# Patient Record
Sex: Female | Born: 1955
Health system: Southern US, Community
[De-identification: ages and names within clinical notes are randomized; demographics above are authoritative.]

## PROBLEM LIST (undated history)

## (undated) DIAGNOSIS — M1990 Unspecified osteoarthritis, unspecified site: Secondary | ICD-10-CM

## (undated) DIAGNOSIS — K219 Gastro-esophageal reflux disease without esophagitis: Secondary | ICD-10-CM

## (undated) DIAGNOSIS — R55 Syncope and collapse: Secondary | ICD-10-CM

## (undated) DIAGNOSIS — Z8709 Personal history of other diseases of the respiratory system: Secondary | ICD-10-CM

## (undated) DIAGNOSIS — D649 Anemia, unspecified: Secondary | ICD-10-CM

## (undated) DIAGNOSIS — D219 Benign neoplasm of connective and other soft tissue, unspecified: Secondary | ICD-10-CM

## (undated) DIAGNOSIS — Z8742 Personal history of other diseases of the female genital tract: Secondary | ICD-10-CM

## (undated) DIAGNOSIS — J45909 Unspecified asthma, uncomplicated: Secondary | ICD-10-CM

## (undated) HISTORY — DX: Unspecified osteoarthritis, unspecified site: M19.90

## (undated) HISTORY — DX: Benign neoplasm of connective and other soft tissue, unspecified: D21.9

## (undated) HISTORY — PX: OTHER SURGICAL HISTORY: SHX169

## (undated) HISTORY — DX: Syncope and collapse: R55

## (undated) HISTORY — PX: COLONOSCOPY: SHX174

## (undated) HISTORY — DX: Unspecified asthma, uncomplicated: J45.909

## (undated) HISTORY — DX: Personal history of other diseases of the respiratory system: Z87.09

## (undated) HISTORY — DX: Personal history of other diseases of the female genital tract: Z87.42

## (undated) HISTORY — DX: Gastro-esophageal reflux disease without esophagitis: K21.9

## (undated) HISTORY — DX: Anemia, unspecified: D64.9

## (undated) HISTORY — PX: BREAST EXCISIONAL BIOPSY: SUR124

---

## 1978-03-17 HISTORY — PX: WISDOM TOOTH EXTRACTION: SHX21

## 1983-03-18 HISTORY — PX: REDUCTION MAMMAPLASTY: SUR839

## 1983-03-18 HISTORY — PX: BREAST SURGERY: SHX581

## 1997-09-20 ENCOUNTER — Ambulatory Visit (HOSPITAL_COMMUNITY): Admission: RE | Admit: 1997-09-20 | Discharge: 1997-09-20 | Payer: Self-pay | Admitting: General Surgery

## 1998-07-25 ENCOUNTER — Other Ambulatory Visit: Admission: RE | Admit: 1998-07-25 | Discharge: 1998-07-25 | Payer: Self-pay | Admitting: Obstetrics

## 1998-10-02 ENCOUNTER — Encounter (HOSPITAL_BASED_OUTPATIENT_CLINIC_OR_DEPARTMENT_OTHER): Payer: Self-pay | Admitting: General Surgery

## 1998-10-02 ENCOUNTER — Ambulatory Visit (HOSPITAL_COMMUNITY): Admission: RE | Admit: 1998-10-02 | Discharge: 1998-10-02 | Payer: Self-pay | Admitting: General Surgery

## 1999-10-01 ENCOUNTER — Other Ambulatory Visit: Admission: RE | Admit: 1999-10-01 | Discharge: 1999-10-01 | Payer: Self-pay | Admitting: Obstetrics

## 1999-10-07 ENCOUNTER — Encounter: Payer: Self-pay | Admitting: Obstetrics

## 1999-10-07 ENCOUNTER — Ambulatory Visit (HOSPITAL_COMMUNITY): Admission: RE | Admit: 1999-10-07 | Discharge: 1999-10-07 | Payer: Self-pay | Admitting: Obstetrics

## 2005-05-29 ENCOUNTER — Ambulatory Visit: Payer: Self-pay | Admitting: Internal Medicine

## 2005-06-02 ENCOUNTER — Ambulatory Visit: Payer: Self-pay | Admitting: Internal Medicine

## 2005-06-06 ENCOUNTER — Encounter: Admission: RE | Admit: 2005-06-06 | Discharge: 2005-06-06 | Payer: Self-pay | Admitting: Internal Medicine

## 2005-06-18 ENCOUNTER — Ambulatory Visit: Payer: Self-pay | Admitting: Gastroenterology

## 2005-06-23 ENCOUNTER — Ambulatory Visit: Payer: Self-pay | Admitting: Gastroenterology

## 2005-07-02 ENCOUNTER — Ambulatory Visit: Payer: Self-pay | Admitting: Gastroenterology

## 2006-06-23 ENCOUNTER — Ambulatory Visit (HOSPITAL_COMMUNITY): Admission: RE | Admit: 2006-06-23 | Discharge: 2006-06-23 | Payer: Self-pay | Admitting: Internal Medicine

## 2006-11-17 ENCOUNTER — Ambulatory Visit: Payer: Self-pay | Admitting: Internal Medicine

## 2006-11-17 ENCOUNTER — Encounter: Payer: Self-pay | Admitting: Internal Medicine

## 2006-12-17 ENCOUNTER — Encounter: Payer: Self-pay | Admitting: Internal Medicine

## 2006-12-17 ENCOUNTER — Ambulatory Visit: Payer: Self-pay | Admitting: Internal Medicine

## 2006-12-17 DIAGNOSIS — R21 Rash and other nonspecific skin eruption: Secondary | ICD-10-CM | POA: Insufficient documentation

## 2007-01-13 ENCOUNTER — Ambulatory Visit: Payer: Self-pay | Admitting: Internal Medicine

## 2007-01-13 ENCOUNTER — Other Ambulatory Visit: Admission: RE | Admit: 2007-01-13 | Discharge: 2007-01-13 | Payer: Self-pay | Admitting: Internal Medicine

## 2007-01-13 ENCOUNTER — Encounter: Payer: Self-pay | Admitting: Internal Medicine

## 2007-01-13 LAB — CONVERTED CEMR LAB
BUN: 9 mg/dL (ref 6–23)
Basophils Absolute: 0 10*3/uL (ref 0.0–0.1)
Creatinine, Ser: 0.8 mg/dL (ref 0.4–1.2)
Eosinophils Absolute: 0.1 10*3/uL (ref 0.0–0.6)
GFR calc Af Amer: 98 mL/min
GFR calc non Af Amer: 81 mL/min
Hemoglobin: 11.5 g/dL — ABNORMAL LOW (ref 12.0–15.0)
MCHC: 34.2 g/dL (ref 30.0–36.0)
MCV: 81 fL (ref 78.0–100.0)
Monocytes Absolute: 0.6 10*3/uL (ref 0.2–0.7)
Monocytes Relative: 9.3 % (ref 3.0–11.0)
Neutrophils Relative %: 56.8 % (ref 43.0–77.0)
Potassium: 4 meq/L (ref 3.5–5.1)
RDW: 14.9 % — ABNORMAL HIGH (ref 11.5–14.6)
Sodium: 138 meq/L (ref 135–145)

## 2007-01-14 DIAGNOSIS — D259 Leiomyoma of uterus, unspecified: Secondary | ICD-10-CM | POA: Insufficient documentation

## 2007-01-14 DIAGNOSIS — R1314 Dysphagia, pharyngoesophageal phase: Secondary | ICD-10-CM | POA: Insufficient documentation

## 2007-06-24 ENCOUNTER — Ambulatory Visit (HOSPITAL_COMMUNITY): Admission: RE | Admit: 2007-06-24 | Discharge: 2007-06-24 | Payer: Self-pay | Admitting: Internal Medicine

## 2007-07-06 ENCOUNTER — Encounter (INDEPENDENT_AMBULATORY_CARE_PROVIDER_SITE_OTHER): Payer: Self-pay | Admitting: Obstetrics and Gynecology

## 2007-07-06 ENCOUNTER — Inpatient Hospital Stay (HOSPITAL_COMMUNITY): Admission: RE | Admit: 2007-07-06 | Discharge: 2007-07-08 | Payer: Self-pay | Admitting: Obstetrics and Gynecology

## 2007-07-06 HISTORY — PX: TOTAL ABDOMINAL HYSTERECTOMY W/ BILATERAL SALPINGOOPHORECTOMY: SHX83

## 2007-08-16 ENCOUNTER — Ambulatory Visit: Payer: Self-pay | Admitting: Internal Medicine

## 2007-08-16 DIAGNOSIS — J069 Acute upper respiratory infection, unspecified: Secondary | ICD-10-CM | POA: Insufficient documentation

## 2007-10-28 ENCOUNTER — Encounter: Payer: Self-pay | Admitting: Internal Medicine

## 2008-03-09 ENCOUNTER — Telehealth: Payer: Self-pay | Admitting: Internal Medicine

## 2008-07-15 LAB — CONVERTED CEMR LAB: Pap Smear: NORMAL

## 2008-09-26 ENCOUNTER — Ambulatory Visit (HOSPITAL_COMMUNITY): Admission: RE | Admit: 2008-09-26 | Discharge: 2008-09-26 | Payer: Self-pay | Admitting: Internal Medicine

## 2009-07-03 ENCOUNTER — Ambulatory Visit: Payer: Self-pay | Admitting: Internal Medicine

## 2009-07-03 DIAGNOSIS — J309 Allergic rhinitis, unspecified: Secondary | ICD-10-CM | POA: Insufficient documentation

## 2009-07-04 ENCOUNTER — Telehealth: Payer: Self-pay | Admitting: Internal Medicine

## 2009-07-05 ENCOUNTER — Encounter (INDEPENDENT_AMBULATORY_CARE_PROVIDER_SITE_OTHER): Payer: Self-pay | Admitting: *Deleted

## 2009-07-05 ENCOUNTER — Ambulatory Visit: Payer: Self-pay | Admitting: Internal Medicine

## 2009-07-05 DIAGNOSIS — K5732 Diverticulitis of large intestine without perforation or abscess without bleeding: Secondary | ICD-10-CM | POA: Insufficient documentation

## 2009-07-05 LAB — CONVERTED CEMR LAB
Basophils Absolute: 0 10*3/uL (ref 0.0–0.1)
Bilirubin Urine: NEGATIVE
Eosinophils Relative: 1.1 % (ref 0.0–5.0)
HCT: 37.9 % (ref 36.0–46.0)
Leukocytes, UA: NEGATIVE
Lymphocytes Relative: 29.5 % (ref 12.0–46.0)
Monocytes Relative: 8.5 % (ref 3.0–12.0)
Neutrophils Relative %: 60.4 % (ref 43.0–77.0)
Nitrite: NEGATIVE
Platelets: 328 10*3/uL (ref 150.0–400.0)
RDW: 14.9 % — ABNORMAL HIGH (ref 11.5–14.6)
Specific Gravity, Urine: >=1.03 (ref 1.000–1.030)
WBC: 6.5 10*3/uL (ref 4.5–10.5)
pH: 6 (ref 5.0–8.0)

## 2009-07-10 ENCOUNTER — Telehealth: Payer: Self-pay | Admitting: Internal Medicine

## 2009-07-13 ENCOUNTER — Encounter (INDEPENDENT_AMBULATORY_CARE_PROVIDER_SITE_OTHER): Payer: Self-pay | Admitting: *Deleted

## 2009-07-27 ENCOUNTER — Encounter (INDEPENDENT_AMBULATORY_CARE_PROVIDER_SITE_OTHER): Payer: Self-pay | Admitting: *Deleted

## 2009-07-31 ENCOUNTER — Ambulatory Visit: Payer: Self-pay | Admitting: Gastroenterology

## 2009-08-14 ENCOUNTER — Ambulatory Visit: Payer: Self-pay | Admitting: Gastroenterology

## 2009-09-12 ENCOUNTER — Emergency Department (HOSPITAL_COMMUNITY): Admission: EM | Admit: 2009-09-12 | Discharge: 2009-09-12 | Payer: Self-pay | Admitting: Emergency Medicine

## 2009-09-27 ENCOUNTER — Ambulatory Visit (HOSPITAL_COMMUNITY): Admission: RE | Admit: 2009-09-27 | Discharge: 2009-09-27 | Payer: Self-pay | Admitting: Internal Medicine

## 2010-04-16 NOTE — Procedures (Signed)
Summary: Colonoscopy  Patient: Heidi Stevens Note: All result statuses are Final unless otherwise noted.  Tests: (1) Colonoscopy (COL)   COL Colonoscopy           [facility]ONE      [facility]     [address]     [place], [place]  [address]           COLONOSCOPY PROCEDURE REPORT           PATIENT:  Heidi Stevens, Heidi Stevens  MR#:  [id number]     BIRTHDATE:  10/24/1955, 53 yrs. old  GENDER:  female     ENDOSCOPIST:  Rachael Fee, MD     REF. BY:  Rosalyn Gess. Norins, M.D.     PROCEDURE DATE:  08/14/2009     PROCEDURE:  Diagnostic Colonoscopy     ASA CLASS:  Class II     INDICATIONS:  Routine Risk Screening     MEDICATIONS:  Fentanyl 75 mcg IV, Versed 7 mg IV           DESCRIPTION OF PROCEDURE:   After the risks benefits and     alternatives of the procedure were thoroughly explained, informed     consent was obtained.  Digital rectal exam was performed and     revealed no rectal masses.   The  endoscope was introduced through     the anus and advanced to the cecum, which was identified by both     the appendix and ileocecal valve, without limitations.  The     quality of the prep was good, using MoviPrep.  The instrument was     then slowly withdrawn as the colon was fully examined.     <<PROCEDUREIMAGES>>     FINDINGS:  Mild diverticulosis was found in the sigmoid to     descending colon segments (see image1).  This was otherwise a     normal examination of the colon (see image2, image3, and image5).     Retroflexed views in the rectum revealed no abnormalities.    The     scope was then withdrawn from the patient and the procedure     completed.           COMPLICATIONS:  None           ENDOSCOPIC IMPRESSION:     1) Mild diverticulosis in the sigmoid to descending colon     segments     2) Otherwise normal examination     3) No polyps or cancers           RECOMMENDATIONS:     1) Continue current colorectal screening recommendations for     "routine risk" patients with a repeat  colonoscopy in 10 years.           REPEAT EXAM:  10 years           ______________________________     Rachael Fee, MD           n.     eSIGNED:   Rachael Fee at 08/14/2009 09:34 AM           Sloan Leiter, [id number]  Note: An exclamation mark (!) indicates a result that was not dispersed into the flowsheet. Document Creation Date: 08/14/2009 9:35 AM _______________________________________________________________________  (1) Order result status: Final Collection or observation date-time: 08/14/2009 09:32 Requested date-time:  Receipt date-time:  Reported date-time:  Referring Physician:   Ordering Physician: Rob Bunting [phone]) Specimen Source:  Source:  Launa Grill Order Number: [id number] Lab site:   Appended Document: Colonoscopy    Clinical Lists Changes  Observations: Added new observation of COLONNXTDUE: 07/2019 (08/14/2009 12:50)

## 2010-04-16 NOTE — Letter (Signed)
Summary: Out of Work  [facility]  [address] [place], [place] [address]   Phone: [phone]  Fax: [phone]    July 05, 2009   Employee:  Heidi Stevens    To Whom It May Concern:   For Medical reasons, please excuse the above named employee from work for the following dates:  Start: 07/04/09    End: 07/05/09, May return to work tomorrow 07/06/09    If you need additional information, please feel free to contact our office.         Sincerely,    Dr. Rene Paci

## 2010-04-16 NOTE — Miscellaneous (Signed)
Summary: [facility] PREVISIT/PREP  Clinical Lists Changes  Medications: Added new medication of MOVIPREP 100 GM  SOLR (PEG-KCL-NACL-NASULF-NA ASC-C) As per prep instructions. - Signed Rx of MOVIPREP 100 GM  SOLR (PEG-KCL-NACL-NASULF-NA ASC-C) As per prep instructions.;  #1 x 0;  Signed;  Entered by: Wyona Almas RN;  Authorized by: Rachael Fee MD;  Method used: Electronically to [facility] [phone]*, [address] [place], [place], [place]  [address], Ph: [phone] or [phone], Fax: [phone] Observations: Added new observation of NKA: T (07/31/2009 15:09)    Prescriptions: MOVIPREP 100 GM  SOLR (PEG-KCL-NACL-NASULF-NA ASC-C) As per prep instructions.  #1 x 0   Entered by:   Wyona Almas RN   Authorized by:   Rachael Fee MD   Signed by:   Wyona Almas RN on 07/31/2009   Method used:   Electronically to        [facility] [phone]* (retail)       [address]       [place], [place]  [address]       Ph: [phone] or [phone]       Fax: [phone]   RxID:   [id number]

## 2010-04-16 NOTE — Letter (Signed)
Summary: Previsit letter  [facility]  [address] [place], [place] [address]   Phone: [phone]  Fax: [phone]       07/13/2009 MRN: [id number]  [facility] [address] [place], [place]  [address]  Dear Ms. Tavares,  Welcome to the Gastroenterology Division at [facility].    You are scheduled to see a nurse for your pre-procedure visit on 07-31-09 at 3:30p.m. on the 3rd floor at [facility], [address]  We ask that you try to arrive at our office 15 minutes prior to your appointment time to allow for check-in.  Your nurse visit will consist of discussing your medical and surgical history, your immediate family medical history, and your medications.    Please bring a complete list of all your medications or, if you prefer, bring the medication bottles and we will list them.  We will need to be aware of both prescribed and over the counter drugs.  We will need to know exact dosage information as well.  If you are on blood thinners (Coumadin, Plavix, Aggrenox, Ticlid, etc.) please call our office today/prior to your appointment, as we need to consult with your physician about holding your medication.   Please be prepared to read and sign documents such as consent forms, a financial agreement, and acknowledgement forms.  If necessary, and with your consent, a friend or relative is welcome to sit-in on the nurse visit with you.  Please bring your insurance card so that we may make a copy of it.  If your insurance requires a referral to see a specialist, please bring your referral form from your primary care physician.  No co-pay is required for this nurse visit.     If you cannot keep your appointment, please call [phone] to cancel or reschedule prior to your appointment date.  This allows [place] the opportunity to schedule an appointment for another patient in need of care.    Thank you for choosing [facility] for your medical needs.   We appreciate the opportunity to care for you.  Please visit [place] at our website  to learn more about our practice.                     Sincerely.                                                                                                                   The Gastroenterology Division

## 2010-04-16 NOTE — Assessment & Plan Note (Signed)
Summary: abd pain / SD   Vital Signs:  Patient profile:   55 year old female Height:      64 inches (162.56 cm) Weight:      218.0 pounds (99.09 kg) O2 Sat:      98 % on Room air Temp:     98.5 degrees F (36.94 degrees C) oral Pulse rate:   63 / minute BP sitting:   110 / 82  (left arm) Cuff size:   large  Vitals Entered By: Orlan Leavens (July 05, 2009 8:48 AM)  O2 Flow:  Room air CC: Abdominal pain, nausated Is Patient Diabetic? No Pain Assessment Patient in pain? yes     Location: abdomen Type: sharp   Primary Care Provider:  norins  CC:  Abdominal pain and nausated.  History of Present Illness:  Abdominal Pain      This is a 55 year old woman who presents with Abdominal pain.  The symptoms began 2 days ago.  On a scale of 1 to 10, the intensity is described as an 8.  no prior colonoscopy, no known diverticular dz, no prior hx same type symptoms.  The patient reports nausea and anorexia, but denies vomiting, diarrhea, constipation, and hematochezia.  The location of the pain is right lower quadrant.  The pain is described as intermittent, sharp, and radiating to the back.  The patient denies the following symptoms: fever, dysuria, and chest pain.  The pain is worse with movement and during initiation of defication.  The pain is better with completion of defecation and fasting.    Current Medications (verified): 1)  Omeprazole 20 Mg  Tbec (Omeprazole) .Marland Kitchen.. 1 Once Daily  Allergies (verified): No Known Drug Allergies  Past History:  Past Medical History: UCD G2P2  Review of Systems  The patient denies fever, chest pain, syncope, severe indigestion/heartburn, hematuria, and incontinence.    Physical Exam  General:  overweight-appearing.  alert, well-developed, well-nourished, and cooperative to examination.   nontoxic Lungs:  normal respiratory effort, no intercostal retractions or use of accessory muscles; normal breath sounds bilaterally - no crackles and no  wheezes.    Heart:  normal rate, regular rhythm, no murmur, and no rub. BLE without edema.  Abdomen:  tender to mod palp over RLQ - no r/g - +BS, no CVA pain   Impression & Recommendations:  Problem # 1:  DIVERTICULITIS, ACUTE (ICD-562.11) most probable dx - check labs now to r/o UTI/PN but begin emepric cipro/flagyl -  reports is being sched for colo this year (age >15) - Labs Reviewed: Hgb: 11.5 (01/13/2007)   Hct: 33.6 (01/13/2007)   WBC: 6.2 (01/13/2007)  Orders: TLB-CBC Platelet - w/Differential (85025-CBCD) TLB-Udip w/ Micro (81001-URINE)  Complete Medication List: 1)  Omeprazole 20 Mg Tbec (Omeprazole) .Marland Kitchen.. 1 once daily 2)  Cipro 500 Mg Tabs (Ciprofloxacin hcl) .Marland Kitchen.. 1 by mouth two times a day x 7days 3)  Flagyl 500 Mg Tabs (Metronidazole) .Marland Kitchen.. 1 by mouth three times a day x 7 days  Patient Instructions: 1)  it was good to see you today. 2)  test(s) ordered today - your results will be posted on the phone tree for review in 48-72 hours from the time of test completion; call [phone] and enter your 9 digit MRN (listed above on this page, just below your name); if any changes need to be made or there are abnormal results, you will be contacted directly.  3)  start antibiotics for probable diverticulutis symptoms causing your abdominal  pain - cipro + flagyl -your prescriptions have been electronically submitted to your pharmacy. Please take as directed. Contact our office if you believe you're having problems with the medication(s).  4)  Information/education from up to date provided to you 5)  if your symptoms continue to worsen (pain, fever, etc), or if you are unable take anything by mouth (pills, fluids, etc), you should go to the emergency room for further evaluation and treatment.  6)  Drink clear liquids only for the next 24 hours, then slowly add other liquids and food as you  tolerate them.start with bland plain foods like toast, rice, potates and avoid fatty foods or  creams/dairy until you are feeling better Prescriptions: FLAGYL 500 MG TABS (METRONIDAZOLE) 1 by mouth three times a day x 7 days  #21 x 0   Entered and Authorized by:   Newt Lukes MD   Signed by:   Newt Lukes MD on 07/05/2009   Method used:   Electronically to        [facility] [phone]* (retail)       [address]       [place], [place]  [address]       Ph: [phone] or [phone]       Fax: [phone]   RxID:   [id number] CIPRO 500 MG TABS (CIPROFLOXACIN HCL) 1 by mouth two times a day x 7days  #14 x 0   Entered and Authorized by:   Newt Lukes MD   Signed by:   Newt Lukes MD on 07/05/2009   Method used:   Electronically to        [facility] [phone]* (retail)       [address]       [place], [place]  [address]       Ph: [phone] or [phone]       Fax: [phone]   RxID:   [id number]

## 2010-04-16 NOTE — Assessment & Plan Note (Signed)
Summary: FU---STC   Vital Signs:  Patient profile:   55 year old female Height:      64 inches (162.56 cm) Weight:      218.25 pounds (99.20 kg) BMI:     37.60 O2 Sat:      96 % on Room air Temp:     98.6 degrees F (37.00 degrees C) oral Pulse rate:   67 / minute Pulse rhythm:   regular BP sitting:   106 / 76  (left arm) Cuff size:   large  Vitals Entered By: Brenton Grills (July 03, 2009 3:12 PM)  O2 Flow:  Room air CC: pt here for routine ov/pt states she is no longer taking iron or z pack or promethazine cough syrup/aj   Primary Care Provider:  Areana Kosanke  CC:  pt here for routine ov/pt states she is no longer taking iron or z pack or promethazine cough syrup/aj.  History of Present Illness: Patient presents c/o allergic rhinnitis type symptoms with sinus congestion exacerbated by p9ollen and her husbands smoking. She denies any acute illness: no fever, sweats, chills or other sypmtoms.   Current Medications (verified): 1)  Iron 18 Mg Tbcr (Ferrous Fumarate) .... Take 1 Tablet By Mouth Twice A Day 2)  Omeprazole 20 Mg  Tbec (Omeprazole) .Marland Kitchen.. 1 Once Daily 3)  Zithromax Z-Pak 250 Mg  Tabs (Azithromycin) .... As Directed 4)  Promethazine-Codeine 6.25-10 Mg/69ml  Syrp (Promethazine-Codeine) .Marland Kitchen.. 1 Tsp Q 6 Hrs For Cough.  Allergies (verified): No Known Drug Allergies  Past History:  Past Medical History: Last updated: 11/17/2006 UCD G2P2  Past Surgical History: Last updated: 08/16/2007 arthroscopy R knee '94 reduction mammoplasty '85 ([place])   G2P2 w/ c-sections  Family History: Last updated: 08/16/2007 Family History Hypertension family history of diabetes father - '32: HTN, DM, CVA mother - '37: CVA, HTN Neg- breast or colon cancer; CAD  Social History: Last updated: 11/17/2006 Married dtr 30, son 36 works at [employer]  Risk Factors: Smoking Status: quit (11/17/2006)  Review of Systems       The patient complains of hoarseness, prolonged cough,  and headaches.  The patient denies anorexia, fever, weight loss, weight gain, vision loss, decreased hearing, chest pain, syncope, dyspnea on exertion, peripheral edema, hemoptysis, abdominal pain, hematochezia, severe indigestion/heartburn, muscle weakness, depression, and enlarged lymph nodes.    Physical Exam  General:  overweight AA female in no distress Head:  mild tenderness to percussion over the facial sinuses Eyes:  corneas and lenses clear and no injection.   Ears:  External ear exam shows no significant lesions or deformities.  Otoscopic examination reveals clear canals, tympanic membranes are intact bilaterally without bulging, retraction, inflammation or discharge. Hearing is grossly normal bilaterally. Nose:  erythema and swelling of the turbinates. Mouth:  cobblestone appearance of the posterior pharynx. Lungs:  normal respiratory effort and normal breath sounds.   Heart:  normal rate and regular rhythm.     Impression & Recommendations:  Problem # 1:  ALLERGIC RHINITIS CAUSE UNSPECIFIED (ICD-477.9) Patient with uncomplicated allergic rhinnitis  Plan - avoid allergens            \OTC loratadine        OTC sudafed as needed.   Complete Medication List: 1)  Iron 18 Mg Tbcr (Ferrous fumarate) .... Take 1 tablet by mouth twice a day 2)  Omeprazole 20 Mg Tbec (Omeprazole) .Marland Kitchen.. 1 once daily 3)  Zithromax Z-pak 250 Mg Tabs (Azithromycin) .... As directed 4)  Promethazine-codeine  6.25-10 Mg/66ml Syrp (Promethazine-codeine) .Marland Kitchen.. 1 tsp q 6 hrs for cough.   Preventive Care Screening  Mammogram:    Date:  07/15/2008    Results:  normal   Pap Smear:    Date:  07/15/2008    Results:  normal

## 2010-04-16 NOTE — Letter (Signed)
Summary: [facility] Instructions  [facility]  [address] [place], [place] [address]   Phone: [phone]  Fax: [phone]       GOLDYE TOURANGEAU    [birth year]    MRN: [id number]        Procedure Day Dorna Bloom:  Jake Shark  08/14/09     Arrival Time:  9:00am     Procedure Time:  10:00am     Location of Procedure:                    Juliann Pares _  [facility] (4th Floor)                        PREPARATION FOR COLONOSCOPY WITH MOVIPREP   Starting 5 days prior to your procedure  THURSDAY 08/09/09  do not eat nuts, seeds, popcorn, corn, beans, peas,  salads, or any raw vegetables.  Do not take any fiber supplements (e.g. Metamucil, Citrucel, and Benefiber).  THE DAY BEFORE YOUR PROCEDURE         DATE: MONDAY  08/13/09   1.  Drink clear liquids the entire day-NO SOLID FOOD  2.  Do not drink anything colored red or purple.  Avoid juices with pulp.  No orange juice.  3.  Drink at least 64 oz. (8 glasses) of fluid/clear liquids during the day to prevent dehydration and help the prep work efficiently.  CLEAR LIQUIDS INCLUDE: Water Jello Ice Popsicles Tea (sugar ok, no milk/cream) Powdered fruit flavored drinks Coffee (sugar ok, no milk/cream) Gatorade Juice: apple, white grape, white cranberry  Lemonade Clear bullion, consomm, broth Carbonated beverages (any kind) Strained chicken noodle soup Hard Candy                             4.  In the morning, mix first dose of MoviPrep solution:    Empty 1 Pouch A and 1 Pouch B into the disposable container    Add lukewarm drinking water to the top line of the container. Mix to dissolve    Refrigerate (mixed solution should be used within 24 hrs)  5.  Begin drinking the prep at 5:00 p.m. The MoviPrep container is divided by 4 marks.   Every 15 minutes drink the solution down to the next mark (approximately 8 oz) until the full liter is complete.   6.  Follow completed prep with 16 oz of clear liquid of your choice  (Nothing red or purple).  Continue to drink clear liquids until bedtime.  7.  Before going to bed, mix second dose of MoviPrep solution:    Empty 1 Pouch A and 1 Pouch B into the disposable container    Add lukewarm drinking water to the top line of the container. Mix to dissolve    Refrigerate  THE DAY OF YOUR PROCEDURE      DATE:  TUESDAY  08/14/09  Beginning at  5:00 a.m. (5 hours before procedure):         1. Every 15 minutes, drink the solution down to the next mark (approx 8 oz) until the full liter is complete.  2. Follow completed prep with 16 oz. of clear liquid of your choice.    3. You may drink clear liquids until  8:00am  (2 HOURS BEFORE PROCEDURE).   MEDICATION INSTRUCTIONS  Unless otherwise instructed, you should take regular prescription medications with a small sip of water  as early as possible the morning of your procedure.          OTHER INSTRUCTIONS  You will need a responsible adult at least 53 years of age to accompany you and drive you home.   This person must remain in the waiting room during your procedure.  Wear loose fitting clothing that is easily removed.  Leave jewelry and other valuables at home.  However, you may wish to bring a book to read or  an iPod/MP3 player to listen to music as you wait for your procedure to start.  Remove all body piercing jewelry and leave at home.  Total time from sign-in until discharge is approximately 2-3 hours.  You should go home directly after your procedure and rest.  You can resume normal activities the  day after your procedure.  The day of your procedure you should not:   Drive   Make legal decisions   Operate machinery   Drink alcohol   Return to work  You will receive specific instructions about eating, activities and medications before you leave.    The above instructions have been reviewed and explained to me by   Wyona Almas RN  Jul 31, 2009 3:40 PM     I fully  understand and can verbalize these instructions _____________________________ Date _________

## 2010-04-16 NOTE — Progress Notes (Signed)
Summary: Side effects from Abx  Phone Note Call from Patient Call back at [facility] Phone [phone]   Summary of Call: Pt stopped antibiotic b/c of nausea, diarrhea, and stomach cramping. She tried 1 two times a day but then stopped after taking 4 doses. Symptoms are slowly getting better. Also c/o coating of white on tongue, "medicine" taste in mouth and orange urine. Has decreased fluid intake b/c of nausea. Pt has slight decrease in abd pain from when seen by Dr Felicity Coyer.  Initial call taken by: Lamar Sprinkles, CMA,  July 10, 2009 1:07 PM  Follow-up for Phone Call        Reviewed Dr. Diamantina Monks note. Reviewed labs: normal U/A , normal CBC.  Plan - ok to leave off antibiotics.           Hydrate - gator aide          if "white coating" on tongue doesn't improve with hydration will call in magic mouthwash.  Patient called. She wants to move ahead with colonoscopy. [facility] notified Follow-up by: Jacques Navy MD,  July 10, 2009 6:51 PM  New Problems: SPECIAL SCREENING FOR MALIGNANT NEOPLASMS COLON (ICD-V76.51)   New Problems: SPECIAL SCREENING FOR MALIGNANT NEOPLASMS COLON (ICD-V76.51)

## 2010-04-16 NOTE — Progress Notes (Signed)
Summary: ABD PAIN   Phone Note Other Incoming   Caller: [phone] Summary of Call: Pt called and states that she is having left  lower abd pain, sharp in nature. with nausea. Pt states pain was nagging in nature yesterday but has gotten progressively worse. What do you advise? Initial call taken by: Ami Bullins CMA,  July 04, 2009 4:35 PM  Follow-up for Phone Call        seen yesterday with no mention of abdominal pain. At this late hour if the pain is severe she should go to Urgent care. If it isn't bad we can work her in tomorrow (if I am booked ok for a colleague to see). Follow-up by: Jacques Navy MD,  July 04, 2009 5:03 PM  Additional Follow-up for Phone Call Additional follow up Details #1::        Pt informed, scheduled for tomorrow Additional Follow-up by: Lamar Sprinkles, CMA,  July 04, 2009 5:50 PM

## 2010-07-20 ENCOUNTER — Other Ambulatory Visit: Payer: Self-pay | Admitting: Internal Medicine

## 2010-07-30 NOTE — Discharge Summary (Signed)
NAMEMARGURIETE, Heidi Stevens                 ACCOUNT NO.:  [id number]   MEDICAL RECORD NO.:  [id number]          PATIENT TYPE:  INP   LOCATION:  9302                          FACILITY:  [facility]   PHYSICIAN:  Hal Morales, M.D.DATE OF BIRTH:  Sep 03, 1955   DATE OF ADMISSION:  07/06/2007  DATE OF DISCHARGE:  07/08/2007                               DISCHARGE SUMMARY   ADMISSION DIAGNOSES:  1. Symptomatic uterine fibroids.  2. Menorrhagia.  3. Pelvic pain.  4. Anemia.   DISCHARGE DIAGNOSES:  1. Symptomatic uterine fibroids.  2. Menorrhagia.  3. Pelvic pain.  4. Anemia.   DISCHARGE INSTRUCTIONS:  Printed instructions from [facility].   DISCHARGE MEDICATIONS:  1. Percocet 1-2 p.o. q.4 h. p.r.n. pain.  2. Ibuprofen 600 mg p.o. q.6 h. for 24 hours, then q.6 h. p.r.n. pain.  3. Omeprazole 20 mg p.o. daily.  4. Iron sulfate 1 p.o. b.i.d.   FOLLOW-UP INSTRUCTIONS:  The patient has a followup appointment in 6  weeks with Dr. Pennie Rushing at [facility], a division of  [facility] for [facility].   HOSPITAL COURSE:  The patient was admitted to the hospital after having  undergone a total abdominal hysterectomy and bilateral salpingectomy.  Her postoperative course was uneventful with her starting on p.o. feeds  on postoperative day #1 and p.o. pain medication.  Her immediate  postoperative hemoglobin was 10.6 having started with a hemoglobin of  11.6.  Her postoperative white blood cell count was 13.8.  The patient  remained afebrile throughout her course, and by postoperative day #2,  was tolerating a regular diet, ambulating well, voiding without  need for catheterization, and having adequate relief from her pain  medication.  Her Jackson-Pratt drain was removed on postoperative day #2  without difficulty.  Her incision was intact and beginning to heal.  She  was thus deemed to have received maximal hospital benefit and be ready  for discharge home.      Hal Morales, M.D.  Electronically Signed     VPH/MEDQ  D:  07/08/2007  T:  07/08/2007  Job:  [id number]   cc:   Rosalyn Gess. Norins, MD  [address]  [place]  [place] [address]

## 2010-07-30 NOTE — Op Note (Signed)
NAMEJEANENE, MENA                 ACCOUNT NO.:  [id number]   MEDICAL RECORD NO.:  [id number]          PATIENT TYPE:  INP   LOCATION:  9302                          FACILITY:  [facility]   PHYSICIAN:  Hal Morales, M.D.DATE OF BIRTH:  1956-03-01   DATE OF PROCEDURE:  07/06/2007  DATE OF DISCHARGE:                               OPERATIVE REPORT   PREOPERATIVE DIAGNOSES:  1. Uterine fibroids.  2. Menorrhagia.  3. Anemia.   POSTOPERATIVE DIAGNOSES:  1. Uterine fibroids.  2. Menorrhagia.  3. Anemia.   OPERATION:  Total abdominal hysterectomy, bilateral salpingectomy.   SURGEON:  Hal Morales, M.D.   FIRST ASSISTANT:  Janine Limbo, M.D.   ANESTHESIA:  General orotracheal.   ESTIMATED BLOOD LOSS:  300 mL.   COMPLICATIONS:  None.   FINDINGS:  The uterus was enlarged to 20-week size with multiple  myomata.  The ovaries appeared normal bilaterally.  The tubes were  status post tubal ligation for surgical sterilization.   SPECIMENS TO PATHOLOGY:  Uterus, cervix, right and left fallopian tubes.   PROCEDURE:  The patient was taken to the operating room after  appropriate identification and placed on the operating table.  After the  attainment of adequate general anesthesia, she was placed in the supine  position.  The abdomen, perineum and vagina were prepped with multiple  layers of Betadine and a Foley catheter inserted into the bladder and  connected to straight drainage.  The abdomen was draped as a sterile  field.  A suprapubic incision was made and the abdomen opened in layers.  The peritoneum was entered and examination of the upper abdomen revealed  no abnormalities other than the aforementioned enlarged uterus.  The  uterus was then grasped at the cornual regions and elevated into the  incision.  The left round ligament was then identified, clamped, suture  ligated and incised.  That incision was taken anteriorly on the anterior  leaf of the broad ligament.   The utero-ovarian ligament was then  isolated, clamped, cut, tied with a free tie and suture ligated.  A  similar procedure was carried out with the round ligament and the utero-  ovarian ligament on the right side.  The bladder was sharply dissected  off the anterior fibroid and the anterior cervix down to the level of  the anticipated vaginal angle.  The uterine arteries on the right and  left side were skeletonized, clamped, cut and suture ligated.  The  parametrial tissues were then clamped, cut and suture ligated down to  the level of the cervix.  The uterine fundus was then excised and  removed from the operative field.  The paracervical tissues were  clamped, cut and suture ligated.  The uterosacral ligaments were then  clamped, cut and suture ligated and those sutures held.  The vaginal  angles were clamped, cut and suture ligated and the remainder of the  cervix excised from the upper vagina.  A figure-of-eight suture was then  used to close the vaginal cuff.  Hemostasis was achieved with figure-of-  eight sutures on the left  side between the uterosacral ligament and the  vaginal angle.  The uterosacral ligament sutures and vaginal angle  sutures were then tied together.  Copious irrigation was carried out.  The left and right fallopian tubes were then clamped and cut and suture  ligated with the tube being removed from the operative field.  Again  copious irrigation was carried out and hemostasis noted to be adequate.  The appendix was visualized and felt to be within normal limits.  The  ureters on the right and left side were visualized and felt to be within  normal limits.  All instruments were then removed from the abdominal  cavity and the abdominal peritoneum closed with a running suture of 2-0  Vicryl.  The rectus muscles were copiously irrigated and made hemostatic  with Bovie cautery.  The abdominal fascia was evaluated and the skin  undermined at the subcutaneous tissue  in the midline of the fascial  incision to relax the fascia for closure.  The fascia was then closed  with a running suture of 0 Vicryl from each apex to the midline.  The  subcutaneous tissue was copiously irrigated and made hemostatic with  Bovie cautery.  A subcutaneous Jackson-Pratt drain was placed through a  stab incision in the right suprapubic region and tied in with a suture  of 0 silk.  The skin incision was closed with a subcuticular suture of 3-  0 Monocryl.  Marcaine 0.25% 30 mL was placed in the incision.  A sterile  dressing was applied and the patient awakened from general anesthesia  and taken to the recovery room in satisfactory condition having  tolerated the procedure well, with sponge and instrument counts correct.      Hal Morales, M.D.  Electronically Signed     VPH/MEDQ  D:  07/06/2007  T:  07/06/2007  Job:  [id number]   cc:   Rosalyn Gess. Norins, MD  [address]  [place]  [place] [address]

## 2010-07-30 NOTE — H&P (Signed)
NAME:  Heidi Stevens, THEILEN NO.:  [id number]   MEDICAL RECORD NO.:  [id number]          PATIENT TYPE:   LOCATION:                                 FACILITY:   PHYSICIAN:  Hal Morales, M.D.DATE OF BIRTH:  Mar 17, 1956   DATE OF ADMISSION:  07/06/2007  DATE OF DISCHARGE:                              HISTORY & PHYSICAL   HISTORY OF PRESENT ILLNESS:  The patient is a 55 year old black female  para 2-0-0-2 who presents for definitive therapy of symptomatic  fibroids.  The patient has known about her fibroids for many years but  over the last 6-7 years has had increasingly heavy menstrual periods  with significant cramping.  She denies intermenstrual bleeding but her  cramping can be severe enough that she does have to miss work.  She has  2 days of her cycle each month of the 5-6 days that she bleeds that are  extremely heavy, sometimes soiling her clothing or her bed linens.  She  has had no previous therapy for her uterine fibroids.  Her last Pap  smear was in October 2008 and was normal.  Her last menstrual period  began in January 2009 at which time she was given Depo-Provera to  prevent further heavy bleeding until she could have her surgical  procedure.   PAST GYNECOLOGICAL HISTORY:  The patient underwent menarche at age 74  and had monthly menses 5-6 days in length.  She has never had an  abnormal Pap smear.  She uses tubal ligation as her method of  contraception performed at the time of her last delivery.   OBSTETRICAL HISTORY:  The patient had cesarean sections.   SURGICAL HISTORY:  1. Breast reduction in 1985.  2. Third molar extraction in 1918.  3. Previously mentioned C-sections.   MEDICAL HISTORY:  Significant for anemia.   FAMILY HISTORY:  Significant for hypertension in her mother and father.  Diabetes in her father and stroke in her mother and father, both of whom  are alive at ages 4 and 66, respectively.   CURRENT MEDICATIONS:  1.  Omeprazole 20 mg p.o. daily.  2. Slow release iron supplement 325 mg daily.  3. Ibuprofen p.r.n. last dose taken over a month ago.   DRUG SENSITIVITIES:  None known.   REVIEW OF SYSTEMS:  Negative for syncope, dizziness, fatigue, or hair  loss.  According to the patient she had a normal thyroid evaluation with  her primary care physician, Dr. Debby Bud.   LABORATORY STUDIES:  Ultrasound shows uterus that is 14 cm x 7 x 11 with  a right lateral myoma measuring 11 x 9 cm of fundal myoma measuring 7.8  x 6.8 cm, left lower uterine segment anterior fibroid measuring 5.3 x  5.3 cm.  The ovaries were not well evaluated secondary to the size of  the uterus.  Endometrial biopsy done on April 15, 2007, showed  atrophic endometrium with no hyperplasia, atypia or malignancy.   PHYSICAL EXAMINATION:  The patient goes up above the umbilicus by about  5 cm.  This is nontender.  EXTREMITIES:  No clubbing, cyanosis or edema.  PELVIC:  EGBUS within normal limits.  The vagina is rugous.  The cervix  is without lesions, though there is a small amount of blood from the os.  The uterus measures somewhere around 20 weeks size and is minimally  mobile.  No separable adnexal masses are palpated.  Rectovaginal reveals  no rectovaginal septal masses.   IMPRESSION:  1. Large uterine fibroids.  2. Menorrhagia and dysmenorrhea.  3. Anemia.  4. History of gastroesophageal reflux disease.   DISPOSITION:  Several discussions have been held with the patient  concerning options for management of her fibroids and she wishes to  undergo hysterectomy.  The risks of anesthesia, bleeding, infection,  damage to adjacent organs are all reviewed with her and the patient  wishes to proceed.  The ovaries will be left in place per her request  unless there is significant pathology that will require their removal.      Hal Morales, M.D.  Electronically Signed     VPH/MEDQ  D:  07/01/2007  T:  07/01/2007  Job:   [id number]

## 2010-08-19 ENCOUNTER — Ambulatory Visit (INDEPENDENT_AMBULATORY_CARE_PROVIDER_SITE_OTHER): Payer: BC Managed Care – PPO | Admitting: Internal Medicine

## 2010-08-19 VITALS — BP 110/80 | HR 73 | Temp 97.9°F | Wt 215.0 lb

## 2010-08-19 DIAGNOSIS — J040 Acute laryngitis: Secondary | ICD-10-CM

## 2010-08-24 NOTE — Progress Notes (Signed)
  Subjective:    Patient ID: Heidi Stevens, female    DOB: 1956/02/24, 55 y.o.   MRN: [id number]  HPI Patinet presents with a several day h/o URI symptoms which has developed into largyngitis/loss of voice. She has had no fever, chills, rhinorrhea, productive cough, SOB. She has had no N/V/D, myalgias or arthralgias. She has no odynophagia or dysphagia.  PMH, FamHx and SocHx reviewed for any changes and relevance.    Review of Systems Review of Systems  Constitutional:  Negative for fever, chills, activity change and unexpected weight change.  HENT:  Negative for hearing loss, ear pain, congestion, neck stiffness and postnasal drip.   Eyes: Negative for pain, discharge and visual disturbance.  Respiratory: Negative for chest tightness and wheezing.   Cardiovascular: Negative for chest pain and palpitations.       [No decreased exercise tolerance Gastrointestinal: [No change in bowel habit. No bloating or gas. No reflux or indigestion Genitourinary: Negative for urgency, frequency, flank pain and difficulty urinating.  Musculoskeletal: Negative for myalgias, back pain, arthralgias and gait problem.  Neurological: Negative for dizziness, tremors, weakness and headaches.  Hematological: Negative for adenopathy.  Psychiatric/Behavioral: Negative for behavioral problems and dysphoric mood.       Objective:   Physical Exam vitls noted. Gen'l overweight AA female in no acute distress. HEENT- TMs nl, oropharynx without lesions, posterior pharynx clear w/o exudate. Pul- normal respirations Cor - RRR       Assessment & Plan:  1. Pharyngitis - no evidence of a bacterial infection.  Plan - supportive care           Advised to watch for any change in symptoms, i.e. Fever, cough, drainage.

## 2010-09-09 ENCOUNTER — Other Ambulatory Visit: Payer: Self-pay | Admitting: Internal Medicine

## 2010-10-16 ENCOUNTER — Telehealth: Payer: Self-pay | Admitting: *Deleted

## 2010-10-16 DIAGNOSIS — Z0000 Encounter for general adult medical examination without abnormal findings: Secondary | ICD-10-CM

## 2010-10-16 NOTE — Telephone Encounter (Signed)
Patient dropped off medical form to be completed for her job. It is requesting last fasting CBG and cholesterol. Pt's last labs can not be used because they must be completed between 11/29/2009 and 11/30/2010. It also requires weight, BP & height to be recorded during these times. We will schedule pt for OV - would you like to have patient do labs prior to OV?

## 2010-10-17 NOTE — Telephone Encounter (Signed)
Work required annual doc visit - OK. OK for pre visit labs v70.0

## 2010-10-17 NOTE — Telephone Encounter (Signed)
Left pt VM to call office and schedule OV. Labs needed prior, orders entered. (Fasting is required) Please help schedule.

## 2010-10-22 ENCOUNTER — Encounter: Payer: Self-pay | Admitting: Internal Medicine

## 2010-10-23 ENCOUNTER — Other Ambulatory Visit (INDEPENDENT_AMBULATORY_CARE_PROVIDER_SITE_OTHER): Payer: BC Managed Care – PPO

## 2010-10-23 ENCOUNTER — Ambulatory Visit (INDEPENDENT_AMBULATORY_CARE_PROVIDER_SITE_OTHER): Payer: BC Managed Care – PPO | Admitting: Internal Medicine

## 2010-10-23 DIAGNOSIS — K219 Gastro-esophageal reflux disease without esophagitis: Secondary | ICD-10-CM

## 2010-10-23 DIAGNOSIS — Z0000 Encounter for general adult medical examination without abnormal findings: Secondary | ICD-10-CM

## 2010-10-23 LAB — COMPREHENSIVE METABOLIC PANEL
Albumin: 3.3 g/dL — ABNORMAL LOW (ref 3.5–5.2)
BUN: 9 mg/dL (ref 6–23)
CO2: 29 mEq/L (ref 19–32)
Calcium: 8.6 mg/dL (ref 8.4–10.5)
Chloride: 105 mEq/L (ref 96–112)
Creatinine, Ser: 0.8 mg/dL (ref 0.4–1.2)
GFR: 93.8 mL/min (ref >60.00)
Glucose, Bld: 100 mg/dL — ABNORMAL HIGH (ref 70–99)
Potassium: 4 mEq/L (ref 3.5–5.1)

## 2010-10-23 LAB — CBC WITH DIFFERENTIAL/PLATELET
Basophils Absolute: 0 10*3/uL (ref 0.0–0.1)
HCT: 37.2 % (ref 36.0–46.0)
Lymphs Abs: 1.6 10*3/uL (ref 0.7–4.0)
MCV: 84.2 fl (ref 78.0–100.0)
Monocytes Absolute: 0.5 10*3/uL (ref 0.1–1.0)
Monocytes Relative: 9.7 % (ref 3.0–12.0)
Neutrophils Relative %: 59.6 % (ref 43.0–77.0)
Platelets: 313 10*3/uL (ref 150.0–400.0)
RDW: 15.4 % — ABNORMAL HIGH (ref 11.5–14.6)
WBC: 5.6 10*3/uL (ref 4.5–10.5)

## 2010-10-23 LAB — HEPATIC FUNCTION PANEL
Bilirubin, Direct: 0.1 mg/dL (ref 0.0–0.3)
Total Bilirubin: 0.6 mg/dL (ref 0.3–1.2)
Total Protein: 7.3 g/dL (ref 6.0–8.3)

## 2010-10-23 LAB — BASIC METABOLIC PANEL
BUN: 9 mg/dL (ref 6–23)
Creatinine, Ser: 0.8 mg/dL (ref 0.4–1.2)
GFR: 93.8 mL/min (ref >60.00)
Glucose, Bld: 100 mg/dL — ABNORMAL HIGH (ref 70–99)
Potassium: 4 mEq/L (ref 3.5–5.1)

## 2010-10-23 LAB — LIPID PANEL
Cholesterol: 158 mg/dL (ref 0–200)
Triglycerides: 67 mg/dL (ref 0.0–149.0)

## 2010-10-23 NOTE — Progress Notes (Signed)
  Subjective:    Patient ID: Heidi Stevens, female    DOB: 18-Jun-1955, 55 y.o.   MRN: [id number]  HPI Heidi Stevens presents for routine follow-up.IN the interval since her last CPX in April of '11 she has been doing well: no major illness, no surgery and no injury. Life is good. She is due to see her Gyn in September that will include her pelvic exam and breast exam.   No past medical history on file. Past Surgical History  Procedure Date  . Breast surgery     reduction mammoplsty  . Arthrsocopy knee     right knee in '04  . G2p2    Family History  Problem Relation Age of Onset  . Diabetes Mother   . Hypertension Mother    History   Social History  . Marital Status: Married    Spouse Name: N/A    Number of Children: 2  . Years of Education: 12   Occupational History  .  [employer]   Social History Main Topics  . Smoking status: Former Games developer  . Smokeless tobacco: Never Used  . Alcohol Use: Yes  . Drug Use: No  . Sexually Active: Yes -- Female partner(s)   Other Topics Concern  . Not on file   Social History Narrative   HSG. Married. 2 children: 1 dtr - '78, 1 son '91. Works at [employer]. Denies history of abuse             Review of Systems Review of Systems  Constitutional:  Negative for fever, chills, activity change and unexpected weight change.  HEENT:  Negative for hearing loss, ear pain, congestion, neck stiffness and postnasal drip. Negative for sore throat or swallowing problems. Negative for dental complaints.   Eyes: Negative for vision loss or change in visual acuity.  Respiratory: Negative for chest tightness and wheezing.   Cardiovascular: Negative for chest pain and palpitation. No decreased exercise tolerance Gastrointestinal: No change in bowel habit. No bloating or gas. No reflux or indigestion Genitourinary: Negative for urgency, frequency, flank pain and difficulty urinating.  Musculoskeletal: Negative for myalgias, back pain,  arthralgias and gait problem.  Neurological: Negative for dizziness, tremors, weakness and headaches.  Hematological: Negative for adenopathy.  Psychiatric/Behavioral: Negative for behavioral problems and dysphoric mood.       Objective:   Physical Exam Vitals reviewed. Gen'l: well nourished, well developed AA woman in no distress HEENT - [place]/AT, EACs/TMs normal, oropharynx with native dentition in good condition, no buccal or palatal lesions, posterior pharynx clear, mucous membranes moist. C&S clear, PERRLA, fundi - normal Neck - supple, no thyromegaly Nodes- negative submental, cervical, supraclavicular regions Chest - no deformity, no CVAT Lungs - cleat without rales, wheezes. No increased work of breathing Breast - deferred to gyn Cardiovascular - regular rate and rhythm, quiet precordium, no murmurs, rubs or gallops, 2+ radial, DP and PT pulses Abdomen - BS+ x 4, no HSM, no guarding or rebound or tenderness Pelvic - deferred to gyn Rectal - deferred to gyn Extremities - no clubbing, cyanosis, edema or deformity.  Neuro - A&O x 3, CN II-XII normal, motor strength normal and equal, DTRs 2+ and symmetrical biceps, radial, and patellar tendons. Cerebellar - no tremor, no rigidity, fluid movement and normal gait. Derm - Head, neck, back, abdomen and extremities without suspicious lesions  CBC normal, Chemistries normal, Cholesterol levels normal          Assessment & Plan:

## 2010-10-24 ENCOUNTER — Encounter: Payer: Self-pay | Admitting: Internal Medicine

## 2010-10-24 DIAGNOSIS — Z0000 Encounter for general adult medical examination without abnormal findings: Secondary | ICD-10-CM | POA: Insufficient documentation

## 2010-10-24 DIAGNOSIS — K219 Gastro-esophageal reflux disease without esophagitis: Secondary | ICD-10-CM | POA: Insufficient documentation

## 2010-10-24 NOTE — Assessment & Plan Note (Signed)
Has h/o reflux that is well controlled on PPI therapy.  Plan - renew Rx

## 2010-10-24 NOTE — Assessment & Plan Note (Signed)
Interval medical history is benign. Physical exam, sans breast and pelvic exam deferred to gyn, is normal. Lab results are normal with normal cholesterol, blood sugar and general chemistries. She reports that she is current with gyn exam, last mammogram July '11. She has never had colonoscopy and is a candidate for routine screening. Immunizations - due for Tdap.  In summary - a healthy woman who is doing well. She is asked to obtain records for mammogram. She is encouraged to have colonoscopy and to work on weight management with portion size control, smart choices and regular exercise. She will return in 2 years or prn.

## 2010-10-28 ENCOUNTER — Encounter: Payer: Self-pay | Admitting: Internal Medicine

## 2010-11-28 ENCOUNTER — Other Ambulatory Visit (HOSPITAL_COMMUNITY): Payer: Self-pay | Admitting: Obstetrics and Gynecology

## 2010-11-28 DIAGNOSIS — Z1231 Encounter for screening mammogram for malignant neoplasm of breast: Secondary | ICD-10-CM

## 2010-12-06 ENCOUNTER — Ambulatory Visit (HOSPITAL_COMMUNITY): Payer: BC Managed Care – PPO | Attending: Obstetrics and Gynecology

## 2010-12-10 LAB — CBC
HCT: 31.6 — ABNORMAL LOW
HCT: 34.4 — ABNORMAL LOW
Hemoglobin: 11.6 — ABNORMAL LOW
MCHC: 33.7
MCV: 79.3
MCV: 80.3
Platelets: 333
RBC: 4.33
WBC: 13.8 — ABNORMAL HIGH
WBC: 6.4

## 2011-05-13 ENCOUNTER — Other Ambulatory Visit: Payer: Self-pay | Admitting: Internal Medicine

## 2011-05-13 NOTE — Telephone Encounter (Signed)
Done

## 2011-09-29 ENCOUNTER — Ambulatory Visit: Payer: BC Managed Care – PPO | Admitting: Internal Medicine

## 2011-10-28 ENCOUNTER — Ambulatory Visit (HOSPITAL_COMMUNITY)
Admission: RE | Admit: 2011-10-28 | Discharge: 2011-10-28 | Disposition: A | Discharge status: Home / Self Care | Payer: BC Managed Care – PPO | Source: Ambulatory Visit | Attending: Obstetrics and Gynecology | Admitting: Obstetrics and Gynecology

## 2011-10-28 DIAGNOSIS — Z1231 Encounter for screening mammogram for malignant neoplasm of breast: Secondary | ICD-10-CM

## 2011-12-09 DIAGNOSIS — Z8709 Personal history of other diseases of the respiratory system: Secondary | ICD-10-CM | POA: Insufficient documentation

## 2011-12-09 DIAGNOSIS — Z8742 Personal history of other diseases of the female genital tract: Secondary | ICD-10-CM | POA: Insufficient documentation

## 2011-12-09 DIAGNOSIS — D219 Benign neoplasm of connective and other soft tissue, unspecified: Secondary | ICD-10-CM | POA: Insufficient documentation

## 2011-12-09 DIAGNOSIS — D649 Anemia, unspecified: Secondary | ICD-10-CM | POA: Insufficient documentation

## 2011-12-11 ENCOUNTER — Ambulatory Visit (INDEPENDENT_AMBULATORY_CARE_PROVIDER_SITE_OTHER): Payer: BC Managed Care – PPO | Admitting: Obstetrics and Gynecology

## 2011-12-11 ENCOUNTER — Encounter: Payer: Self-pay | Admitting: Obstetrics and Gynecology

## 2011-12-11 VITALS — BP 102/64 | Ht 62.0 in | Wt 214.0 lb

## 2011-12-11 DIAGNOSIS — D259 Leiomyoma of uterus, unspecified: Secondary | ICD-10-CM

## 2011-12-11 NOTE — Progress Notes (Signed)
Last Pap: 2008 WNL: Yes Regular Periods:no Contraception: Hysterectomy  Monthly Breast exam:yes Tetanus<17yrs:no Nl.Bladder Function:yes Daily BMs:yes Healthy Diet:yes Calcium:no Mammogram:yes Date of Mammogram: 10/30/2011 Exercise:yes Have often Exercise: occasional  Seatbelt: yes Abuse at home: no Stressful work:no Sigmoid-colonoscopy: 2011 Bone Density: No PCP: Dr. Illene Regulus Change in PMH: None Change in [facility]: None   Subjective:    Heidi Stevens is a 56 y.o. female G2P2 who presents for annual exam.  The patient has no complaints today.   The following portions of the patient's history were reviewed and updated as appropriate: allergies, current medications, past family history, past medical history, past social history, past surgical history and problem list.  Review of Systems Pertinent items are noted in HPI. Gastrointestinal:No change in bowel habits, no abdominal pain, no rectal bleeding Genitourinary:negative for dysuria, frequency, hematuria, nocturia and urinary incontinence    Objective:     BP 102/64  Ht 5\' 2"  (1.575 m)  Wt 214 lb (97.07 kg)  BMI 39.14 kg/m2  Weight:  Wt Readings from Last 1 Encounters:  12/11/11 214 lb (97.07 kg)     BMI: Body mass index is 39.14 kg/(m^2). General Appearance: Alert, appropriate appearance for age. No acute distress HEENT: Grossly normal Neck / Thyroid: Supple, no masses, nodes or enlargement Lungs: clear to auscultation bilaterally Back: No CVA tenderness Breast Exam: bilateral fibrocystic changes.  No distinct masses.  Well healed incisions s/p reduction mammoplasty. Cardiovascular: Regular rate and rhythm. S1, S2, no murmur Gastrointestinal: Soft, non-tender, no masses or organomegaly Pelvic Exam: External genitalia: normal general appearance Vaginal: normal rugae and vaginal vault, well healed Cervix: removed surgically Adnexa: non palpable Uterus: removed surgically Exam limited by body  habitus Rectovaginal: normal rectal, no masses Lymphatic Exam: Non-palpable nodes in neck, clavicular, axillary, or inguinal regions Skin: no rash or abnormalities Neurologic: Normal gait and speech, no tremor  Psychiatric: Alert and oriented, appropriate affect.    Urinalysis:Not done    Assessment:    Normal gyn exam  s/p hysterectomy   Plan:   return annually or prn Follow-up:  for annual exam

## 2012-07-09 ENCOUNTER — Ambulatory Visit (INDEPENDENT_AMBULATORY_CARE_PROVIDER_SITE_OTHER): Payer: BC Managed Care – PPO | Admitting: Family Medicine

## 2012-07-09 ENCOUNTER — Encounter: Payer: Self-pay | Admitting: Family Medicine

## 2012-07-09 VITALS — BP 120/82 | HR 74 | Temp 97.8°F | Wt 213.0 lb

## 2012-07-09 DIAGNOSIS — J069 Acute upper respiratory infection, unspecified: Secondary | ICD-10-CM

## 2012-07-09 MED ORDER — HYDROCODONE-HOMATROPINE 5-1.5 MG/5ML PO SYRP: 5.0000 mL | ORAL_SOLUTION | Freq: Every evening | ORAL | Status: DC | PRN

## 2012-07-09 NOTE — Progress Notes (Addendum)
Chief Complaint  Patient presents with  . Cough    HPI:  -started: 7 days ago -symptoms:nasal congestion, sore throat, cough, sneezing drainage in throat - not worse and during the day feels ok, but cough bothers her at night with a scratchy throat -denies:fever, SOB, NVD, tooth pain -has tried: musinex, robitussin DM, OTC sinus -sick contacts: none known -Hx of: has allergies, did have sinus infection in 2009, no chronic lung disease   ROS: See pertinent positives and negatives per HPI.  Past Medical History  Diagnosis Date  . H/O menorrhagia   . Anemia   . Fibroids   . H/O sinusitis     Family History  Problem Relation Age of Onset  . Diabetes Mother   . Hypertension Mother   . Hypertension Father   . Diabetes Father   . Stroke Mother   . Stroke Father   . Asthma Son   . Breast cancer Sister   . Liver cancer Father     History   Social History  . Marital Status: Married    Spouse Name: N/A    Number of Children: 2  . Years of Education: 12   Occupational History  .  [employer]   Social History Main Topics  . Smoking status: Former Games developer  . Smokeless tobacco: Never Used  . Alcohol Use: Yes  . Drug Use: No  . Sexually Active: Yes -- Female partner(s)   Other Topics Concern  . None   Social History Narrative   HSG. Married. 2 children: 1 dtr - '78, 1 son '91. Works at [employer]. Denies history of abuse             Current outpatient prescriptions:omeprazole (PRILOSEC) 20 MG capsule, TAKE ONE CAPSULE BY MOUTH EVERY MORNING, Disp: 30 capsule, Rfl: 5;  HYDROcodone-homatropine (HYCODAN) 5-1.5 MG/5ML syrup, Take 5 mLs by mouth at bedtime as needed for cough., Disp: 120 mL, Rfl: 0  EXAM:  Filed Vitals:   07/09/12 1047  BP: 120/82  Pulse: 74  Temp: 97.8 F (36.6 C)    Body mass index is 38.95 kg/(m^2).  GENERAL: vitals reviewed and listed above, alert, oriented, appears well hydrated and in no acute distress  HEENT: atraumatic, conjunttiva  clear, no obvious abnormalities on inspection of external nose and ears, normal appearance of ear canals and TMs, clear nasal congestion, mild post oropharyngeal erythema with PND, no tonsillar edema or exudate, no sinus TTP  NECK: no obvious masses on inspection  LUNGS: clear to auscultation bilaterally, no wheezes, rales or rhonchi, good air movement  CV: HRRR, no peripheral edema  MS: moves all extremities without noticeable abnormality  PSYCH: pleasant and cooperative, no obvious depression or anxiety  ASSESSMENT AND PLAN:  Discussed the following assessment and plan:  Upper respiratory infection - Plan: HYDROcodone-homatropine (HYCODAN) 5-1.5 MG/5ML syrup  -discussed this is likely a viral URI. Supportive care and return precuations. Cough medication - risks discussed. -Patient advised to return or notify a doctor immediately if symptoms worsen or persist or new concerns arise.  Patient Instructions  INSTRUCTIONS FOR UPPER RESPIRATORY INFECTION:  -plenty of rest and fluids  -take Claritin daily  -nasal saline wash 2-3 times daily (use prepackaged nasal saline or bottled/distilled water if making your own)   -can use sinex or afrin nasal spray for drainage and nasal congestion - but do NOT use longer then 3-4 days  -can use tylenol or ibuprofen as directed for aches and sorethroat  -if you are  taking a cough medication - use only as directed, may also try a teaspoon of honey to coat the throat and throat lozenges  -for sore throat, salt water gargles can help  -follow up if you have fevers, facial pain, tooth pain, difficulty breathing or are worsening or not getting better in 5-7 days      Harrell Niehoff, Dahlia Client R.

## 2012-07-09 NOTE — Patient Instructions (Signed)
INSTRUCTIONS FOR UPPER RESPIRATORY INFECTION:  -plenty of rest and fluids  -take Claritin daily  -nasal saline wash 2-3 times daily (use prepackaged nasal saline or bottled/distilled water if making your own)   -can use sinex or afrin nasal spray for drainage and nasal congestion - but do NOT use longer then 3-4 days  -can use tylenol or ibuprofen as directed for aches and sorethroat  -if you are taking a cough medication - use only as directed, may also try a teaspoon of honey to coat the throat and throat lozenges  -for sore throat, salt water gargles can help  -follow up if you have fevers, facial pain, tooth pain, difficulty breathing or are worsening or not getting better in 5-7 days

## 2013-04-26 ENCOUNTER — Encounter: Payer: Self-pay | Admitting: Family Medicine

## 2013-04-26 ENCOUNTER — Other Ambulatory Visit (INDEPENDENT_AMBULATORY_CARE_PROVIDER_SITE_OTHER): Payer: BC Managed Care – PPO

## 2013-04-26 ENCOUNTER — Ambulatory Visit (INDEPENDENT_AMBULATORY_CARE_PROVIDER_SITE_OTHER): Payer: BC Managed Care – PPO | Admitting: Family Medicine

## 2013-04-26 VITALS — BP 126/82 | HR 78 | Temp 97.6°F | Resp 18 | Wt 216.0 lb

## 2013-04-26 DIAGNOSIS — M25569 Pain in unspecified knee: Secondary | ICD-10-CM

## 2013-04-26 DIAGNOSIS — IMO0002 Reserved for concepts with insufficient information to code with codable children: Secondary | ICD-10-CM

## 2013-04-26 DIAGNOSIS — M25561 Pain in right knee: Secondary | ICD-10-CM

## 2013-04-26 DIAGNOSIS — S83209A Unspecified tear of unspecified meniscus, current injury, unspecified knee, initial encounter: Secondary | ICD-10-CM

## 2013-04-26 MED ORDER — TRAMADOL HCL 50 MG PO TABS: 50.0000 mg | ORAL_TABLET | Freq: Every evening | ORAL | Status: DC | PRN

## 2013-04-26 MED ORDER — MELOXICAM 15 MG PO TABS: 15.0000 mg | ORAL_TABLET | Freq: Every day | ORAL | Status: DC

## 2013-04-26 NOTE — Patient Instructions (Signed)
Very nice to meet you Meloxicam daily for 10 days then as needed.  Tramadol at night if needed Ice 20 minutes 2 times a day Wear brace daily for next 3 weeks, do not wear at night Exercises most days of the week.  Come back in 3 weeks.

## 2013-04-26 NOTE — Progress Notes (Signed)
Pre-visit discussion using our clinic review tool. No additional management support is needed unless otherwise documented below in the visit note.  

## 2013-04-26 NOTE — Progress Notes (Signed)
[facility] [place], [place] [address] Phone: [phone] Subjective:    I'm seeing this patient by the request  of:  Adella Hare, MD   CC: Right knee pain  [id number] Heidi Stevens is a 58 y.o. female coming in with complaint of right knee pain. Patient states she has had this pain for multiple months. Patient states that she didn't notice it after doing a twisting sensation of the knee. Patient states most of the pain is on the medial aspect of the knee. Patient does have times when it feels like the knee is unstable and feels like he can potentially give out on her. Patient denies though any locking, any radiation of pain, or any numbness. Patient is still able to do all her daily activity but states that she is having more pain and is concerned that it is going to continue. Patient is a severity of 6/10. Describes the pain as more of a dull aching sensation that is minimally improved by ibuprofen to     Past medical history, social, surgical and family history all reviewed in electronic medical record.   Review of Systems: No headache, visual changes, nausea, vomiting, diarrhea, constipation, dizziness, abdominal pain, skin rash, fevers, chills, night sweats, weight loss, swollen lymph nodes, body aches, joint swelling, muscle aches, chest pain, shortness of breath, mood changes.   Objective Blood pressure 126/82, pulse 78, temperature 97.6 F (36.4 C), temperature source Oral, resp. rate 18, weight 216 lb (97.977 kg), SpO2 99.00%.  General: No apparent distress alert and oriented x3 mood and affect normal, dressed appropriately.  HEENT: Pupils equal, extraocular movements intact  Respiratory: Patient's speak in full sentences and does not appear short of breath  Cardiovascular: No lower extremity edema, non tender, no erythema  Skin: Warm dry intact with no signs of infection or rash on extremities or on axial skeleton.  Abdomen: Soft  nontender  Neuro: Cranial nerves II through XII are intact, neurovascularly intact in all extremities with 2+ DTRs and 2+ pulses.  Lymph: No lymphadenopathy of posterior or anterior cervical chain or axillae bilaterally.  Gait normal with good balance and coordination.  MSK:  Non tender with full range of motion and good stability and symmetric strength and tone of shoulders, elbows, wrist, hip, and ankles bilaterally.  Knee: Right Normal to inspection with no erythema or effusion or obvious bony abnormalities. Medial joint line tenderness Palpation normal with no warmth,  patellar tenderness, or condyle tenderness. ROM full in flexion and extension and lower leg rotation. Ligaments with solid consistent endpoints including ACL, PCL, LCL, MCL. Positive Mcmurray's, Apley's, and Thessalonian tests. Non painful patellar compression. Patellar glide without crepitus. Patellar and quadriceps tendons unremarkable. Hamstring and quadriceps strength is normal.  Contralateral knee unremarkable  MSK US performed of: Right This study was ordered, performed, and interpreted by Charlann Boxer D.O.  Knee: All structures visualized. anterolateral, posteromedial, and posterolateral menisci unremarkable without tearing, fraying, effusion, or displacement. Patient does have a tear in the medial meniscus that appears to be acute on chronic with mild hypoechoic changes surrounding the area with minimal displacement. Patellar Tendon unremarkable on long and transverse views without effusion. No abnormality of prepatellar bursa. LCL and MCL unremarkable on long and transverse views. No abnormality of origin of medial or lateral head of the gastrocnemius.  IMPRESSION:  Acute on chronic medial meniscal tear    Impression and Recommendations:     This case required medical decision making  of moderate complexity.

## 2013-04-27 DIAGNOSIS — S83209D Unspecified tear of unspecified meniscus, current injury, unspecified knee, subsequent encounter: Secondary | ICD-10-CM | POA: Insufficient documentation

## 2013-04-27 NOTE — Assessment & Plan Note (Signed)
Acute on chronic tear. Patient was described the treatment options, the diagnosis as well as the prognosis. Patient would like to avoid any type of surgery and do to avoid an injection today. Patient was given a brace, home exercise program, and discussed icing protocol. Patient will try these interventions and come back in 3 weeks. If she continues to have pain we'll do an injection at that time.

## 2013-05-17 ENCOUNTER — Encounter: Payer: Self-pay | Admitting: Family Medicine

## 2013-05-17 ENCOUNTER — Ambulatory Visit (INDEPENDENT_AMBULATORY_CARE_PROVIDER_SITE_OTHER): Payer: BC Managed Care – PPO | Admitting: Family Medicine

## 2013-05-17 VITALS — BP 124/76 | HR 63 | Temp 98.2°F | Resp 16 | Wt 212.1 lb

## 2013-05-17 DIAGNOSIS — IMO0002 Reserved for concepts with insufficient information to code with codable children: Secondary | ICD-10-CM

## 2013-05-17 DIAGNOSIS — S83209A Unspecified tear of unspecified meniscus, current injury, unspecified knee, initial encounter: Secondary | ICD-10-CM

## 2013-05-17 NOTE — Patient Instructions (Signed)
Very good to see you Continue exercises 3 times a week Ice 20 minutes at end of day If pain starts use medicines for 3 days straight Come back in 6 weeks to make sure completely healed.  If not we will do ultrasound at that time.

## 2013-05-17 NOTE — Assessment & Plan Note (Addendum)
Patient is improving, patient was given other exercises that I will be beneficial. We discussed proper shoe choices and given other possibly over-the-counter orthotics that could be beneficial. As long as patient continues to improve we will see her again in 3-4 weeks. At that time we will ultrasound the area and make sure that the meniscus is completely healed. Patient has a set back she can come back sooner. Spent greater than 25 minutes with patient face-to-face and had greater than 50% of counseling including as described above in assessment and plan.

## 2013-05-17 NOTE — Progress Notes (Signed)
  [facility] [address] [place], [place] [address] Phone: [phone] Subjective:    CC: Right knee pain followup  [id number] Heidi Stevens is a 58 y.o. female coming in with complaint of right knee pain. Patient was previously diagnosed with an acute on chronic meniscal tear. Patient was given an injection and given home exercise program. Patient states that she is 85% better at this time. States that she can have some mild discomfort after walking long distances but overall is doing much better. Patient denies any popping or giving out on her or any locking. Patient states that everyday it seems to be improving. Denies any nighttime awakening.     Past medical history, social, surgical and family history all reviewed in electronic medical record.   Review of Systems: No headache, visual changes, nausea, vomiting, diarrhea, constipation, dizziness, abdominal pain, skin rash, fevers, chills, night sweats, weight loss, swollen lymph nodes, body aches, joint swelling, muscle aches, chest pain, shortness of breath, mood changes.   Objective Blood pressure 124/76, pulse 63, temperature 98.2 F (36.8 C), temperature source Oral, resp. rate 16, weight 212 lb 1.3 oz (96.199 kg), SpO2 99.00%.  General: No apparent distress alert and oriented x3 mood and affect normal, dressed appropriately.  HEENT: Pupils equal, extraocular movements intact  Respiratory: Patient's speak in full sentences and does not appear short of breath  Cardiovascular: No lower extremity edema, non tender, no erythema  Skin: Warm dry intact with no signs of infection or rash on extremities or on axial skeleton.  Abdomen: Soft nontender  Neuro: Cranial nerves II through XII are intact, neurovascularly intact in all extremities with 2+ DTRs and 2+ pulses.  Lymph: No lymphadenopathy of posterior or anterior cervical chain or axillae bilaterally.  Gait normal with good balance and coordination.    MSK:  Non tender with full range of motion and good stability and symmetric strength and tone of shoulders, elbows, wrist, hip, and ankles bilaterally.  Knee: Right Normal to inspection with no erythema or effusion or obvious bony abnormalities. Medial joint line tenderness still present but less than previous exam Palpation normal with no warmth,  patellar tenderness, or condyle tenderness. ROM full in flexion and extension and lower leg rotation. Ligaments with solid consistent endpoints including ACL, PCL, LCL, MCL. Positive Mcmurray's, Apley's, and Thessalonian tests but does have range of motion improved from previous exam Non painful patellar compression. Patellar glide without crepitus. Patellar and quadriceps tendons unremarkable. Hamstring and quadriceps strength is normal.  Contralateral knee unremarkable    Impression and Recommendations:     This case required medical decision making of moderate complexity.

## 2013-05-17 NOTE — Progress Notes (Signed)
Pre visit review using our clinic review tool, if applicable. No additional management support is needed unless otherwise documented below in the visit note. 

## 2013-05-31 ENCOUNTER — Ambulatory Visit (INDEPENDENT_AMBULATORY_CARE_PROVIDER_SITE_OTHER): Payer: BC Managed Care – PPO | Admitting: Internal Medicine

## 2013-05-31 ENCOUNTER — Other Ambulatory Visit (INDEPENDENT_AMBULATORY_CARE_PROVIDER_SITE_OTHER): Payer: BC Managed Care – PPO

## 2013-05-31 ENCOUNTER — Encounter: Payer: Self-pay | Admitting: Internal Medicine

## 2013-05-31 VITALS — BP 120/60 | HR 54 | Temp 97.4°F | Ht 64.0 in | Wt 214.0 lb

## 2013-05-31 DIAGNOSIS — D649 Anemia, unspecified: Secondary | ICD-10-CM

## 2013-05-31 DIAGNOSIS — Z0000 Encounter for general adult medical examination without abnormal findings: Secondary | ICD-10-CM

## 2013-05-31 DIAGNOSIS — E669 Obesity, unspecified: Secondary | ICD-10-CM

## 2013-05-31 DIAGNOSIS — Z23 Encounter for immunization: Secondary | ICD-10-CM

## 2013-05-31 LAB — HEMOGLOBIN AND HEMATOCRIT, BLOOD
HCT: 40.3 % (ref 36.0–46.0)
HEMOGLOBIN: 13.2 g/dL (ref 12.0–15.0)

## 2013-05-31 LAB — COMPREHENSIVE METABOLIC PANEL
ALBUMIN: 3.6 g/dL (ref 3.5–5.2)
ALK PHOS: 63 U/L (ref 39–117)
ALT: 13 U/L (ref 0–35)
AST: 14 U/L (ref 0–37)
BUN: 16 mg/dL (ref 6–23)
CALCIUM: 8.9 mg/dL (ref 8.4–10.5)
CHLORIDE: 104 meq/L (ref 96–112)
CO2: 29 mEq/L (ref 19–32)
Creatinine, Ser: 1 mg/dL (ref 0.4–1.2)
GFR: 77.44 mL/min (ref >60.00)
GLUCOSE: 88 mg/dL (ref 70–99)
POTASSIUM: 4.3 meq/L (ref 3.5–5.1)
SODIUM: 138 meq/L (ref 135–145)
TOTAL PROTEIN: 7.9 g/dL (ref 6.0–8.3)
Total Bilirubin: 0.4 mg/dL (ref 0.3–1.2)

## 2013-05-31 LAB — LIPID PANEL
Cholesterol: 159 mg/dL (ref 0–200)
HDL: 52.5 mg/dL (ref >39.00)
LDL Cholesterol: 99 mg/dL (ref 0–99)
Total CHOL/HDL Ratio: 3
Triglycerides: 38 mg/dL (ref 0.0–149.0)
VLDL: 7.6 mg/dL (ref 0.0–40.0)

## 2013-05-31 NOTE — Patient Instructions (Signed)
Welcome back and good bye. Your care will be transferred to [facility], [place].  General health - will check routine labs today and results will be posted to MyChart - please sign up TODAY.  You are current with colonoscopy with last exam May 2011 due for follow up in 2021. Immunizations - will get Tdap today and good for 10 years. You need to make those appointments for gyn and mammography.  The biggest health risk you have is your weight. A very important health issue.   Plan Diet management: smart food choices, PORTION SIZE CONTROL, regular exercise. Goal - to loose 1-2 lbs.month. Target weight - 175 lbs ( 39 lbs off over 3 years)

## 2013-05-31 NOTE — Progress Notes (Signed)
   Subjective:    Patient ID: Heidi Stevens, female    DOB: Sep 05, 1955, 58 y.o.   MRN: [id number]  HPI Heidi Stevens presents for "follow up" with last OV in 2012. She reports that she is current with gyn with last visit 2013 and she is due for mammography. Last colonoscopy May 2011.  She has been seeing Dr. Creig Hines for knee pain - who is prescribing tramadol.   She has no other active complaints at this time.   Past Medical History  Diagnosis Date  . H/O menorrhagia   . Anemia   . Fibroids   . H/O sinusitis    Past Surgical History  Procedure Laterality Date  . Breast surgery  1985    reduction mammoplsty  . Arthrsocopy knee      right knee in '04  . G2p2    . Wisdom tooth extraction  1980  . Total abdominal hysterectomy w/ bilateral salpingoophorectomy  07/06/07   Family History  Problem Relation Age of Onset  . Diabetes Mother   . Hypertension Mother   . Hypertension Father   . Diabetes Father   . Stroke Mother   . Stroke Father   . Asthma Son   . Breast cancer Sister   . Liver cancer Father    History   Social History  . Marital Status: Married    Spouse Name: N/A    Number of Children: 2  . Years of Education: 12   Occupational History  .  [employer]   Social History Main Topics  . Smoking status: Former Research scientist (life sciences)  . Smokeless tobacco: Never Used  . Alcohol Use: Yes  . Drug Use: No  . Sexual Activity: Yes    Partners: Male   Other Topics Concern  . Not on file   Social History Narrative   HSG. Married. 2 children: 1 dtr - '78, 1 son '91. Works at Advertising copywriter. Denies history of abuse             Current Outpatient Prescriptions on File Prior to Visit  Medication Sig Dispense Refill  . meloxicam (MOBIC) 15 MG tablet Take 1 tablet (15 mg total) by mouth daily.  30 tablet  0  . omeprazole (PRILOSEC) 20 MG capsule TAKE ONE CAPSULE BY MOUTH EVERY MORNING  30 capsule  5  . traMADol (ULTRAM) 50 MG tablet Take 1 tablet (50 mg total) by mouth at bedtime as  needed.  30 tablet  0   No current facility-administered medications on file prior to visit.      Review of Systems System review is negative for any constitutional, cardiac, pulmonary, GI or neuro symptoms or complaints other than as described in the HPI.     Objective:   Physical Exam Filed Vitals:   05/31/13 1054  BP: 120/60  Pulse: 54  Temp: 97.4 F (36.3 C)   Wt Readings from Last 3 Encounters:  05/31/13 214 lb (97.07 kg)  05/17/13 212 lb 1.3 oz (96.199 kg)  04/26/13 216 lb (97.977 kg)   BP Readings from Last 3 Encounters:  05/31/13 120/60  05/17/13 124/76  04/26/13 126/82   Gen'l- obese woman in no distress HEENT- C&S clear Cor 2+ radial, no JVD, no carotid bruits, RRR PUlm - CTAP Neuro - A&O x 3.       Assessment & Plan:

## 2013-05-31 NOTE — Progress Notes (Signed)
Pre visit review using our clinic review tool, if applicable. No additional management support is needed unless otherwise documented below in the visit note. 

## 2013-05-31 NOTE — Assessment & Plan Note (Signed)
Interval history without acute medical illness, surgery or injury. Has gained weight.Limited physical exam normal. She is current with colorectal cancer screening; instructed to schedule mammography and Gyn visit. Labs reviewed and lipids, glucose and general chemistries are normal. Immunization brought up to date with Tdap.  In summary A nice woman with a primary problem of obesity. She will follow up in 4-6 months.

## 2013-05-31 NOTE — Assessment & Plan Note (Signed)
The biggest health risk you have is your weight. A very important health issue.   Plan Diet management: smart food choices, PORTION SIZE CONTROL, regular exercise. Goal - to loose 1-2 lbs.month. Target weight - 175 lbs ( 39 lbs off over 3 years)

## 2013-06-03 ENCOUNTER — Encounter: Payer: Self-pay | Admitting: Internal Medicine

## 2013-06-28 ENCOUNTER — Ambulatory Visit: Payer: BC Managed Care – PPO | Admitting: Family Medicine

## 2013-07-11 ENCOUNTER — Ambulatory Visit (INDEPENDENT_AMBULATORY_CARE_PROVIDER_SITE_OTHER)
Admission: RE | Admit: 2013-07-11 | Discharge: 2013-07-11 | Disposition: A | Discharge status: Home / Self Care | Payer: BC Managed Care – PPO | Source: Ambulatory Visit | Attending: Family Medicine | Admitting: Family Medicine

## 2013-07-11 ENCOUNTER — Ambulatory Visit (INDEPENDENT_AMBULATORY_CARE_PROVIDER_SITE_OTHER): Payer: BC Managed Care – PPO | Admitting: Family Medicine

## 2013-07-11 ENCOUNTER — Other Ambulatory Visit (INDEPENDENT_AMBULATORY_CARE_PROVIDER_SITE_OTHER): Payer: BC Managed Care – PPO

## 2013-07-11 ENCOUNTER — Encounter: Payer: Self-pay | Admitting: Family Medicine

## 2013-07-11 VITALS — BP 142/90 | HR 56 | Wt 216.0 lb

## 2013-07-11 DIAGNOSIS — IMO0002 Reserved for concepts with insufficient information to code with codable children: Secondary | ICD-10-CM

## 2013-07-11 DIAGNOSIS — M1710 Unilateral primary osteoarthritis, unspecified knee: Secondary | ICD-10-CM | POA: Insufficient documentation

## 2013-07-11 DIAGNOSIS — S83209D Unspecified tear of unspecified meniscus, current injury, unspecified knee, subsequent encounter: Secondary | ICD-10-CM

## 2013-07-11 DIAGNOSIS — M629 Disorder of muscle, unspecified: Secondary | ICD-10-CM

## 2013-07-11 DIAGNOSIS — M7630 Iliotibial band syndrome, unspecified leg: Secondary | ICD-10-CM

## 2013-07-11 MED ORDER — MELOXICAM 15 MG PO TABS: 15.0000 mg | ORAL_TABLET | Freq: Every day | ORAL | Status: DC

## 2013-07-11 MED ORDER — TRAMADOL HCL 50 MG PO TABS: 50.0000 mg | ORAL_TABLET | Freq: Every evening | ORAL | Status: DC | PRN

## 2013-07-11 NOTE — Assessment & Plan Note (Addendum)
Patient does have iliotibial band syndrome. I think this is contributing to her pain. Patient does have a history of a lateral meniscectomy multiple years ago and may have some underlying arthritis we will get an x-ray. Patient given home exercise program we discussed icing protocol. Patient given refills medications. Patient will come back in 2 weeks for further evaluation.  Spent greater than 25 minutes with patient face-to-face and had greater than 50% of counseling including as described above in assessment and plan.

## 2013-07-11 NOTE — Assessment & Plan Note (Signed)
Patient is likely going to have some underlying osteoarthritic changes. The x-rays to further evaluate. We discussed different home exercise program was given a handout to try. We did not discuss any over-the-counter medications but we will and followup if this continues. Patient has a brace and encourage her to wear that as well as well as do an icing regimen. Patient will follow up again in 2 weeks for further evaluation. If she continues to have pain related to consider an intra-articular injection.

## 2013-07-11 NOTE — Progress Notes (Signed)
  [facility] [place], [place] [address] Phone: [phone] Subjective:    CC: Right knee pain followup  [id number] Heidi Stevens is a 57 y.o. female coming in with complaint of right knee pain. Patient was previously diagnosed with an acute on chronic meniscal tear. Patient was given an injection and given home exercise program. Patient was doing considerably better at last followup. Patient states though that some of the pain seems to be worsening. Patient states now when she stands for long amount of time she is more pain on the lateral aspect of her knee. Patient does have a past medical history significant for a lateral meniscectomy multiple years ago. Patient states that it does respond well to the tramadol meloxicam. Patient is not doing exercises quite a regular basis. Patient denies though any clicking popping or giving out on her and states that it is somewhat of a different pain than previously.    Past medical history, social, surgical and family history all reviewed in electronic medical record.   Review of Systems: No headache, visual changes, nausea, vomiting, diarrhea, constipation, dizziness, abdominal pain, skin rash, fevers, chills, night sweats, weight loss, swollen lymph nodes, body aches, joint swelling, muscle aches, chest pain, shortness of breath, mood changes.   Objective Blood pressure 142/90, pulse 56, weight 216 lb (97.977 kg), SpO2 97.00%.  General: No apparent distress alert and oriented x3 mood and affect normal, dressed appropriately.  HEENT: Pupils equal, extraocular movements intact  Respiratory: Patient's speak in full sentences and does not appear short of breath  Cardiovascular: No lower extremity edema, non tender, no erythema  Skin: Warm dry intact with no signs of infection or rash on extremities or on axial skeleton.  Abdomen: Soft nontender  Neuro: Cranial nerves II through XII are intact, neurovascularly  intact in all extremities with 2+ DTRs and 2+ pulses.  Lymph: No lymphadenopathy of posterior or anterior cervical chain or axillae bilaterally.  Gait normal with good balance and coordination.  MSK:  Non tender with full range of motion and good stability and symmetric strength and tone of shoulders, elbows, wrist, hip, and ankles bilaterally.  Knee: Right Normal to inspection with no erythema or effusion or obvious bony abnormalities. Patient does have more tenderness over the lateral joint line and proximal to this area. Palpation normal with no warmth,  patellar tenderness, or condyle tenderness. ROM full in flexion and extension and lower leg rotation. Ligaments with solid consistent endpoints including ACL, PCL, LCL, MCL. Positive Faber with pain on the lateral aspect of the knee Negative Mcmurray's, Apley's, and Thessalonian testsNon painful patellar compression. Patellar glide without crepitus. Patellar and quadriceps tendons unremarkable. Hamstring and quadriceps strength is normal.  Contralateral knee unremarkable  MSK US performed of: Right knee This study was ordered, performed, and interpreted by Charlann Boxer D.O.  Knee: All structures visualized. Anteromedial meniscal tear previously seen has healed. Lateral meniscus has been removed. Patient does have thickening of the IT band but no increased upper flow or torn seem.  Patellar Tendon unremarkable on long and transverse views without effusion. No abnormality of prepatellar bursa. LCL and MCL unremarkable on long and transverse views. No abnormality of origin of medial or lateral head of the gastrocnemius.  IMPRESSION:  Distal iliotibial band syndrome.     Impression and Recommendations:     This case required medical decision making of moderate complexity.

## 2013-07-11 NOTE — Patient Instructions (Signed)
Good to see you Heidi Stevens 20 minutes still after activity Try new exercises 4 times a week Try brace without the sleeve xrays downstairs, no new is good news.  See me again in 2 week sto make sure you are better.

## 2013-07-25 ENCOUNTER — Ambulatory Visit (INDEPENDENT_AMBULATORY_CARE_PROVIDER_SITE_OTHER): Payer: BC Managed Care – PPO | Admitting: Family Medicine

## 2013-07-25 ENCOUNTER — Encounter: Payer: Self-pay | Admitting: Family Medicine

## 2013-07-25 VITALS — BP 130/82 | HR 75

## 2013-07-25 DIAGNOSIS — S83209D Unspecified tear of unspecified meniscus, current injury, unspecified knee, subsequent encounter: Secondary | ICD-10-CM

## 2013-07-25 DIAGNOSIS — IMO0002 Reserved for concepts with insufficient information to code with codable children: Secondary | ICD-10-CM

## 2013-07-25 MED ORDER — TRAMADOL HCL 50 MG PO TABS: 50.0000 mg | ORAL_TABLET | Freq: Every evening | ORAL | Status: DC | PRN

## 2013-07-25 NOTE — Progress Notes (Signed)
  [facility] [place], [place] [address] Phone: [phone] Subjective:    CC: Right knee pain followup  [id number] Heidi Stevens is a 58 y.o. female coming in with complaint of right knee pain. Patient has been seen and did have an acute meniscal tear that seems to be healed. Patient was having more lateral aspect of the knee pain but states now the pain has started to come back on the medial aspect of the knee. Patient states that she is having more mechanical symptoms with some catching. Patient did have an injection for this greater than 3 months ago and did have significant improvement. Patient states that the pain is stopping her from some of her activities such as going up or down stairs secondary to pain.    Past medical history, social, surgical and family history all reviewed in electronic medical record.   Review of Systems: No headache, visual changes, nausea, vomiting, diarrhea, constipation, dizziness, abdominal pain, skin rash, fevers, chills, night sweats, weight loss, swollen lymph nodes, body aches, joint swelling, muscle aches, chest pain, shortness of breath, mood changes.   Objective Blood pressure 130/82, pulse 75, SpO2 98.00%.  General: No apparent distress alert and oriented x3 mood and affect normal, dressed appropriately.  HEENT: Pupils equal, extraocular movements intact  Respiratory: Patient's speak in full sentences and does not appear short of breath  Cardiovascular: No lower extremity edema, non tender, no erythema  Skin: Warm dry intact with no signs of infection or rash on extremities or on axial skeleton.  Abdomen: Soft nontender  Neuro: Cranial nerves II through XII are intact, neurovascularly intact in all extremities with 2+ DTRs and 2+ pulses.  Lymph: No lymphadenopathy of posterior or anterior cervical chain or axillae bilaterally.  Gait normal with good balance and coordination.  MSK:  Non tender with full  range of motion and good stability and symmetric strength and tone of shoulders, elbows, wrist, hip, and ankles bilaterally.  Knee: Right Normal to inspection with no erythema or effusion or obvious bony abnormalities. Patient does have more tenderness over the lateral joint line and proximal to this area. Palpation normal with no warmth,  patellar tenderness, or condyle tenderness. ROM full in flexion and extension and lower leg rotation. Ligaments with solid consistent endpoints including ACL, PCL, LCL, MCL. Positive Faber with pain on the lateral aspect of the knee Positive Mcmurray's, Apley's, and Thessalonian tests Non painful patellar compression. Patellar glide without crepitus. Patellar and quadriceps tendons unremarkable. Hamstring and quadriceps strength is normal.  Contralateral knee unremarkable  After informed written and verbal consent, patient was seated on exam table. Right knee was prepped with alcohol swab and utilizing anterolateral approach, patient's right knee space was injected with 4:1  marcaine 0.5%: Kenalog 40mg /dL. Patient tolerated the procedure well without immediate complications.    Impression and Recommendations:     This case required medical decision making of moderate complexity.

## 2013-07-25 NOTE — Patient Instructions (Signed)
Good to see you as always.  Ice 20 minutes at end of activity Would wear brace daily for 1 week..  Call in 2-3 weeks and tell me better or worse.  If worse we will order MRI.  After MRI is scheduled make an appointment 1-2 days after and we will discuss.

## 2013-07-25 NOTE — Assessment & Plan Note (Addendum)
Injected again today.  Continue ice and bracing,  If no better in 2 weeks will order advance imaging. Patient will continue the exercises and was given other ones at this time. Pending the findings patient may be a candidate for viscous supplementation. Patient with failed all conservative therapy at that time. Patient has a very large meniscal injury then we may need to consider surgical repair.  Spent greater than 25 minutes with patient face-to-face and had greater than 50% of counseling including as described above in assessment and plan.

## 2013-08-09 ENCOUNTER — Other Ambulatory Visit: Payer: Self-pay | Admitting: Family Medicine

## 2013-10-24 ENCOUNTER — Telehealth: Payer: Self-pay | Admitting: Physician Assistant

## 2013-10-24 ENCOUNTER — Other Ambulatory Visit: Payer: Self-pay | Admitting: Internal Medicine

## 2013-10-24 DIAGNOSIS — Z1231 Encounter for screening mammogram for malignant neoplasm of breast: Secondary | ICD-10-CM

## 2013-10-24 NOTE — Telephone Encounter (Signed)
Referral OK

## 2013-10-24 NOTE — Telephone Encounter (Signed)
Notified pt referral has been place will received call back once referral has been set-up with appt, date, and time...Heidi Stevens

## 2013-10-24 NOTE — Telephone Encounter (Signed)
Pt request referral for mammagram for yearly check up. [facility] advise[facility] Please advise, last ov with norins was 05/2013 and schedule with Dr. Doug Sou.

## 2013-11-15 ENCOUNTER — Other Ambulatory Visit: Payer: Self-pay | Admitting: Internal Medicine

## 2013-11-15 ENCOUNTER — Other Ambulatory Visit: Payer: Self-pay | Admitting: Obstetrics

## 2013-11-15 DIAGNOSIS — Z1231 Encounter for screening mammogram for malignant neoplasm of breast: Secondary | ICD-10-CM

## 2013-11-22 ENCOUNTER — Ambulatory Visit (HOSPITAL_COMMUNITY)
Admission: RE | Admit: 2013-11-22 | Discharge: 2013-11-22 | Disposition: A | Discharge status: Home / Self Care | Payer: BC Managed Care – PPO | Source: Ambulatory Visit | Attending: Internal Medicine | Admitting: Internal Medicine

## 2013-11-22 DIAGNOSIS — Z1231 Encounter for screening mammogram for malignant neoplasm of breast: Secondary | ICD-10-CM | POA: Insufficient documentation

## 2013-12-26 ENCOUNTER — Emergency Department (HOSPITAL_COMMUNITY): Payer: BC Managed Care – PPO

## 2013-12-26 ENCOUNTER — Encounter (HOSPITAL_COMMUNITY): Payer: Self-pay | Admitting: Emergency Medicine

## 2013-12-26 ENCOUNTER — Emergency Department (HOSPITAL_COMMUNITY)
Admission: EM | Admit: 2013-12-26 | Discharge: 2013-12-26 | Disposition: A | Discharge status: Home / Self Care | Payer: BC Managed Care – PPO | Attending: Emergency Medicine | Admitting: Emergency Medicine

## 2013-12-26 DIAGNOSIS — R0789 Other chest pain: Secondary | ICD-10-CM | POA: Insufficient documentation

## 2013-12-26 DIAGNOSIS — Z862 Personal history of diseases of the blood and blood-forming organs and certain disorders involving the immune mechanism: Secondary | ICD-10-CM | POA: Diagnosis not present

## 2013-12-26 DIAGNOSIS — Z87891 Personal history of nicotine dependence: Secondary | ICD-10-CM | POA: Insufficient documentation

## 2013-12-26 DIAGNOSIS — K219 Gastro-esophageal reflux disease without esophagitis: Secondary | ICD-10-CM | POA: Insufficient documentation

## 2013-12-26 DIAGNOSIS — Z791 Long term (current) use of non-steroidal anti-inflammatories (NSAID): Secondary | ICD-10-CM | POA: Insufficient documentation

## 2013-12-26 DIAGNOSIS — Z8742 Personal history of other diseases of the female genital tract: Secondary | ICD-10-CM | POA: Diagnosis not present

## 2013-12-26 DIAGNOSIS — M25512 Pain in left shoulder: Secondary | ICD-10-CM | POA: Insufficient documentation

## 2013-12-26 DIAGNOSIS — R0781 Pleurodynia: Secondary | ICD-10-CM

## 2013-12-26 DIAGNOSIS — R05 Cough: Secondary | ICD-10-CM | POA: Diagnosis not present

## 2013-12-26 DIAGNOSIS — R079 Chest pain, unspecified: Secondary | ICD-10-CM

## 2013-12-26 DIAGNOSIS — Z8709 Personal history of other diseases of the respiratory system: Secondary | ICD-10-CM | POA: Insufficient documentation

## 2013-12-26 LAB — CBC
HCT: 38.1 % (ref 36.0–46.0)
HEMOGLOBIN: 12.7 g/dL (ref 12.0–15.0)
MCH: 27.3 pg (ref 26.0–34.0)
MCHC: 33.3 g/dL (ref 30.0–36.0)
MCV: 81.9 fL (ref 78.0–100.0)
Platelets: 305 10*3/uL (ref 150–400)
RBC: 4.65 MIL/uL (ref 3.87–5.11)
RDW: 14.6 % (ref 11.5–15.5)
WBC: 5.9 10*3/uL (ref 4.0–10.5)

## 2013-12-26 LAB — BASIC METABOLIC PANEL
Anion gap: 12 (ref 5–15)
BUN: 11 mg/dL (ref 6–23)
CHLORIDE: 101 meq/L (ref 96–112)
CO2: 28 meq/L (ref 19–32)
CREATININE: 0.95 mg/dL (ref 0.50–1.10)
Calcium: 8.9 mg/dL (ref 8.4–10.5)
GFR calc Af Amer: 76 mL/min — ABNORMAL LOW (ref >90)
GFR calc non Af Amer: 66 mL/min — ABNORMAL LOW (ref >90)
GLUCOSE: 91 mg/dL (ref 70–99)
Potassium: 3.7 mEq/L (ref 3.7–5.3)
Sodium: 141 mEq/L (ref 137–147)

## 2013-12-26 LAB — I-STAT TROPONIN, ED: Troponin i, poc: 0.01 ng/mL (ref 0.00–0.08)

## 2013-12-26 MED ORDER — PREDNISONE 20 MG PO TABS
60.0000 mg | ORAL_TABLET | Freq: Once | ORAL | Status: AC
  Administered 2013-12-26: 60 mg via ORAL
  Filled 2013-12-26: qty 3

## 2013-12-26 MED ORDER — OXYCODONE-ACETAMINOPHEN 5-325 MG PO TABS
2.0000 | ORAL_TABLET | Freq: Once | ORAL | Status: AC
  Administered 2013-12-26: 2 via ORAL
  Filled 2013-12-26: qty 2

## 2013-12-26 MED ORDER — OXYCODONE-ACETAMINOPHEN 5-325 MG PO TABS: 1.0000 | ORAL_TABLET | ORAL | Status: DC | PRN

## 2013-12-26 MED ORDER — PREDNISONE 20 MG PO TABS: 40.0000 mg | ORAL_TABLET | Freq: Every day | ORAL | Status: DC

## 2013-12-26 NOTE — ED Notes (Signed)
Pleurisy with inspiration and movement. Non productive cough. Hx. Of acid reflux.

## 2013-12-26 NOTE — Discharge Instructions (Signed)
Take the prescribed medication as directed.  Use caution if driving while taking percocet. Follow-up with your primary care physician. Return to the ED for new or worsening symptoms.

## 2013-12-26 NOTE — ED Provider Notes (Signed)
CSN: [id number]     Arrival date & time 12/26/13  1421 History   First MD Initiated Contact with Patient 12/26/13 1810     Chief Complaint  Patient presents with  . Chest Pain  . Gastrophageal Reflux     (Consider location/radiation/quality/duration/timing/severity/associated sxs/prior Treatment) Patient is a 58 y.o. female presenting with chest pain and GERD. The history is provided by the patient and medical records.  Chest Pain Associated symptoms: cough   Gastrophageal Reflux Associated symptoms include chest pain and coughing.   This is a 58 year old female with past medical history significant for uterine fibroids and anemia, presenting to the ED for left scapular pain beginning around 11 AM this morning. No known injuries or trauma.  She states pain has been constant, described as a dull ache. Pain worsened with movement, coughing, and deep breathing.  Cough is non-productive.  Denies radiation into neck, jaw, or left arm.  Denies shortness of breath, palpitatons, dizziness, weakness, diaphoresis, nausea, vomiting, fever, chills.  Does have hx of GERD, states different than her usual sx. Patient has no prior cardiac hx. She is not a smoker. No intervention tried PTA. VS stable on arrival.  Past Medical History  Diagnosis Date  . H/O menorrhagia   . Anemia   . Fibroids   . H/O sinusitis    Past Surgical History  Procedure Laterality Date  . Breast surgery  1985    reduction mammoplsty  . Arthrsocopy knee      right knee in '04  . G2p2    . Wisdom tooth extraction  1980  . Total abdominal hysterectomy w/ bilateral salpingoophorectomy  07/06/07   Family History  Problem Relation Age of Onset  . Diabetes Mother   . Hypertension Mother   . Hypertension Father   . Diabetes Father   . Stroke Mother   . Stroke Father   . Asthma Son   . Breast cancer Sister   . Liver cancer Father    History  Substance Use Topics  . Smoking status: Former Research scientist (life sciences)  . Smokeless tobacco:  Never Used  . Alcohol Use: Yes   OB History   Grav Para Term Preterm Abortions TAB SAB Ect Mult Living   2 2        2      Review of Systems  Respiratory: Positive for cough.   Cardiovascular: Positive for chest pain.  All other systems reviewed and are negative.     Allergies  Review of patient's allergies indicates no known allergies.  Home Medications   Prior to Admission medications   Medication Sig Start Date End Date Taking? Authorizing Provider  meloxicam (MOBIC) 15 MG tablet Take 1 tablet (15 mg total) by mouth daily. 07/11/13   Lyndal Pulley, DO  omeprazole (PRILOSEC) 20 MG capsule TAKE ONE CAPSULE BY MOUTH EVERY MORNING 05/13/11   Neena Rhymes, MD  traMADol (ULTRAM) 50 MG tablet Take 1 tablet (50 mg total) by mouth at bedtime as needed. 07/25/13   Lyndal Pulley, DO   BP 151/78  Pulse 57  Temp(Src) 98.4 F (36.9 C) (Oral)  Resp 16  Ht 5\' 2"  (1.575 m)  Wt 220 lb (99.791 kg)  BMI 40.23 kg/m2  SpO2 100%  Physical Exam  Nursing note and vitals reviewed. Constitutional: She is oriented to person, place, and time. She appears well-developed and well-nourished. No distress.  HENT:  Head: Normocephalic and atraumatic.  Mouth/Throat: Oropharynx is clear and moist.  Eyes: Conjunctivae and EOM  are normal. Pupils are equal, round, and reactive to light.  Neck: Normal range of motion. Neck supple.  Cardiovascular: Normal rate, regular rhythm and normal heart sounds.   Pulmonary/Chest: Effort normal and breath sounds normal. No respiratory distress. She has no wheezes. She has no rhonchi. She has no rales.  Respirations unlabored; no wheezes, rhonchi, or rales; no splinting respirations  Abdominal: Soft. Bowel sounds are normal. There is no tenderness. There is no guarding.  Musculoskeletal: Normal range of motion.  Left shoulder without bony abnormalities; mild tenderness just below left scapula; pain reproducible with movement; normal strength and sensation of BUE;  both arms remains NVI  Neurological: She is alert and oriented to person, place, and time.  Skin: Skin is warm and dry. She is not diaphoretic.  Psychiatric: She has a normal mood and affect.    ED Course  Procedures (including critical care time) Labs Review Labs Reviewed  BASIC METABOLIC PANEL - Abnormal; Notable for the following:    GFR calc non Af Amer 66 (*)    GFR calc Af Amer 76 (*)    All other components within normal limits  CBC  I-STAT TROPOININ, ED    Imaging Review Dg Chest 2 View  12/26/2013   CLINICAL DATA:  Left side chest and back pain.  Symptoms today.  EXAM: CHEST  2 VIEW  COMPARISON:  None.  FINDINGS: The heart size and mediastinal contours are within normal limits. Both lungs are clear. The visualized skeletal structures are unremarkable.  IMPRESSION: No active cardiopulmonary disease.   Electronically Signed   By: Rolm Baptise M.D.   On: 12/26/2013 17:15     EKG Interpretation   Date/Time:  Monday December 26 2013 14:27:42 EDT Ventricular Rate:  53 PR Interval:  148 QRS Duration: 86 QT Interval:  422 QTC Calculation: 395 R Axis:   36 Text Interpretation:  Sinus bradycardia Otherwise normal ECG No old  tracing to compare Confirmed by Debby Freiberg [phone]) on 12/26/2013  7:03:07 PM      MDM   Final diagnoses:  Chest pain, unspecified chest pain type  Pleuritic pain   58 year old female with left scapular pain, onset this morning. Pain is been constant since that time, worse with deep breathing, movement, and coughing.  On exam, patient afebrile and overall nontoxic appearing. She is in no acute respiratory distress. Her respirations are unlabored, no splinting noted. EKG sinus bradycardia without acute ischemic changes. Troponin is negative. Chest x-ray is clear. Blood work is reassuring.  Patient has no risk factors for DVT or PE and no history of this. Low probability per Wells criteria.  Pain seems pleuritic in nature, feel atypical for ACS.  Patient given dose of Percocet and prednisone. Will monitor for a brief period for improvement.  After meds, some improvement but still feels "sore" below left scapula.  Pain remains reproducible on exam, VS stable without respiratory distress.  Low suspicion for ACS, PE, dissection, or other acute cardiac event.  HEART score of 1.  Feel patient safe for discharge home with pain medication and prednisone.  Encouraged close FU with PCP.  Discussed plan with patient, he/she acknowledged understanding and agreed with plan of care.  Return precautions given for new or worsening symptoms.  Case discussed with attending physician, Dr. Colin Rhein, who agrees with assessment and plan of care.  Larene Pickett, PA-C 12/26/13 2006  Larene Pickett, PA-C 12/26/13 2012

## 2013-12-27 NOTE — ED Provider Notes (Signed)
Medical screening examination/treatment/procedure(s) were performed by non-physician practitioner and as supervising physician I was immediately available for consultation/collaboration.   EKG Interpretation   Date/Time:  Monday December 26 2013 14:27:42 EDT Ventricular Rate:  53 PR Interval:  148 QRS Duration: 86 QT Interval:  422 QTC Calculation: 395 R Axis:   36 Text Interpretation:  Sinus bradycardia Otherwise normal ECG No old  tracing to compare Confirmed by Debby Freiberg [phone]) on 12/26/2013  7:03:07 PM        Debby Freiberg, MD 12/27/13 [id number]

## 2013-12-30 ENCOUNTER — Other Ambulatory Visit: Payer: Self-pay

## 2014-01-16 ENCOUNTER — Encounter (HOSPITAL_COMMUNITY): Payer: Self-pay | Admitting: Emergency Medicine

## 2014-01-23 ENCOUNTER — Encounter: Payer: BC Managed Care – PPO | Admitting: Internal Medicine

## 2014-07-10 ENCOUNTER — Encounter: Payer: Self-pay | Admitting: Gastroenterology

## 2014-09-05 DIAGNOSIS — L669 Cicatricial alopecia, unspecified: Secondary | ICD-10-CM | POA: Insufficient documentation

## 2014-09-21 ENCOUNTER — Ambulatory Visit (INDEPENDENT_AMBULATORY_CARE_PROVIDER_SITE_OTHER): Payer: BLUE CROSS/BLUE SHIELD | Admitting: Family

## 2014-09-21 ENCOUNTER — Other Ambulatory Visit (INDEPENDENT_AMBULATORY_CARE_PROVIDER_SITE_OTHER): Payer: BLUE CROSS/BLUE SHIELD

## 2014-09-21 ENCOUNTER — Encounter: Payer: Self-pay | Admitting: Family

## 2014-09-21 VITALS — BP 122/84 | HR 61 | Temp 97.9°F | Resp 18 | Ht 62.0 in | Wt 230.0 lb

## 2014-09-21 DIAGNOSIS — Z0000 Encounter for general adult medical examination without abnormal findings: Secondary | ICD-10-CM | POA: Diagnosis not present

## 2014-09-21 DIAGNOSIS — Z23 Encounter for immunization: Secondary | ICD-10-CM

## 2014-09-21 LAB — BASIC METABOLIC PANEL
BUN: 13 mg/dL (ref 6–23)
CALCIUM: 9 mg/dL (ref 8.4–10.5)
CO2: 30 mEq/L (ref 19–32)
Chloride: 105 mEq/L (ref 96–112)
Creatinine, Ser: 0.86 mg/dL (ref 0.40–1.20)
GFR: 87.51 mL/min (ref >60.00)
Glucose, Bld: 86 mg/dL (ref 70–99)
Potassium: 4.1 mEq/L (ref 3.5–5.1)
SODIUM: 140 meq/L (ref 135–145)

## 2014-09-21 LAB — CBC
HEMATOCRIT: 38.6 % (ref 36.0–46.0)
HEMOGLOBIN: 12.9 g/dL (ref 12.0–15.0)
MCHC: 33.3 g/dL (ref 30.0–36.0)
MCV: 83.6 fl (ref 78.0–100.0)
Platelets: 328 10*3/uL (ref 150.0–400.0)
RBC: 4.62 Mil/uL (ref 3.87–5.11)
RDW: 15.3 % (ref 11.5–15.5)
WBC: 7 10*3/uL (ref 4.0–10.5)

## 2014-09-21 LAB — TSH: TSH: 1.55 u[IU]/mL (ref 0.35–4.50)

## 2014-09-21 LAB — LIPID PANEL
CHOLESTEROL: 166 mg/dL (ref 0–200)
HDL: 48.6 mg/dL (ref >39.00)
LDL Cholesterol: 107 mg/dL — ABNORMAL HIGH (ref 0–99)
NonHDL: 117.4
Total CHOL/HDL Ratio: 3
Triglycerides: 54 mg/dL (ref 0.0–149.0)
VLDL: 10.8 mg/dL (ref 0.0–40.0)

## 2014-09-21 LAB — HEMOGLOBIN A1C: Hgb A1c MFr Bld: 5.9 % (ref 4.6–6.5)

## 2014-09-21 NOTE — Addendum Note (Signed)
Addended by: Lowella Dandy on: 09/21/2014 01:51 PM   Modules accepted: Orders

## 2014-09-21 NOTE — Progress Notes (Signed)
Subjective:    Patient ID: Heidi Stevens, female    DOB: 1955/05/26, 59 y.o.   MRN: [id number]  Chief Complaint  Patient presents with  . Establish Care    CPE, not fasting    HPI:  Heidi Stevens is a 59 y.o. female who presents today for an annual wellness visit.   1) Health Maintenance -   Diet - Averages about 2 meals consists of fruits, vegetables, meats; about 2-3 cups of caffeine per day  Exercise - Walks occasionally   2) Preventative Exams / Immunizations:  Dental -- Up to date  Vision -- Due for exam   Health Maintenance  Topic Date Due  . HIV Screening  11/11/1972  . TETANUS/TDAP  11/11/1976  . PAP SMEAR  07/16/2011  . INFLUENZA VACCINE  10/16/2014  . MAMMOGRAM  11/23/2015  . COLONOSCOPY  08/15/2019  Tetanus  Immunization History  Administered Date(s) Administered  . Pneumococcal Conjugate-13 05/31/2013     Review of Systems  Constitutional: Denies fever, chills, fatigue, or significant weight gain/loss. HENT: Head: Denies headache or neck pain Ears: Denies changes in hearing, ringing in ears, earache, drainage Nose: Denies discharge, stuffiness, itching, nosebleed, sinus pain Throat: Denies sore throat, hoarseness, dry mouth, sores, thrush Eyes: Denies loss/changes in vision, pain, redness, blurry/double vision, flashing lights Cardiovascular: Denies chest pain/discomfort, tightness, palpitations, shortness of breath with activity, difficulty lying down, swelling, sudden awakening with shortness of breath Respiratory: Denies shortness of breath, cough, sputum production, wheezing Gastrointestinal: Denies dysphasia, heartburn, change in appetite, nausea, change in bowel habits, rectal bleeding, constipation, diarrhea, yellow skin or eyes Genitourinary: Denies frequency, urgency, burning/pain, blood in urine, incontinence, change in urinary strength. Musculoskeletal: Denies muscle/joint pain, stiffness, back pain, redness or swelling of joints,  trauma Skin: Denies rashes, lumps, itching, dryness, color changes, or hair/nail changes Neurological: Denies dizziness, fainting, seizures, weakness, numbness, tingling, tremor Psychiatric - Denies nervousness, stress, depression or memory loss Endocrine: Denies heat or cold intolerance, sweating, frequent urination, excessive thirst, changes in appetite Hematologic: Denies ease of bruising or bleeding     Objective:    BP 122/84 mmHg  Pulse 61  Temp(Src) 97.9 F (36.6 C) (Oral)  Resp 18  Ht 5\' 2"  (1.575 m)  Wt 230 lb (104.327 kg)  BMI 42.06 kg/m2  SpO2 97% Nursing note and vital signs reviewed.  Physical Exam  Constitutional: She is oriented to person, place, and time. She appears well-developed and well-nourished.  HENT:  Head: Normocephalic.  Right Ear: Hearing, tympanic membrane, external ear and ear canal normal.  Left Ear: Hearing, tympanic membrane, external ear and ear canal normal.  Nose: Nose normal.  Mouth/Throat: Uvula is midline, oropharynx is clear and moist and mucous membranes are normal.  Eyes: Conjunctivae and EOM are normal. Pupils are equal, round, and reactive to light.  Neck: Neck supple. No JVD present. No tracheal deviation present. No thyromegaly present.  Cardiovascular: Normal rate, regular rhythm, normal heart sounds and intact distal pulses.   Pulmonary/Chest: Effort normal and breath sounds normal.  Abdominal: Soft. Bowel sounds are normal. She exhibits no distension and no mass. There is no tenderness. There is no rebound and no guarding.  Musculoskeletal: Normal range of motion. She exhibits no edema or tenderness.  Lymphadenopathy:    She has no cervical adenopathy.  Neurological: She is alert and oriented to person, place, and time. She has normal reflexes. No cranial nerve deficit. She exhibits normal muscle tone. Coordination normal.  Skin: Skin is warm and  dry.  Psychiatric: She has a normal mood and affect. Her behavior is normal. Judgment  and thought content normal.       Assessment & Plan:   Problem List Items Addressed This Visit      Other   Routine general medical examination at a health care facility - Primary    1) Anticipatory Guidance: Discussed importance of wearing a seatbelt while driving and not texting while driving; changing batteries in smoke detector at least once annually; wearing suntan lotion when outside; eating a balanced and moderate diet; getting physical activity at least 30 minutes per day.  2) Immunizations / Screenings / Labs:  Tetanus updated today. All other immunizations are up-to-date per recommendations. Due for a mammogram which will be scheduled independently. Due for a vision screen which will also be scheduled independently. All other screenings are up-to-date per recommendations. Obtain CBC, BMET, Lipid profile, A1c and TSH.   Overall well exam. Patient has cardiovascular risk factors including sedentary lifestyle and obesity. BMI is 42 indicating morbid obesity. Goal established  to lose 5-10% of current body weight through increasing nutrient density and decreasing saturated fats while also has increasing physical activity to 30 minutes most days of the week. Discussed importance of exercise and reducing cardiovascular risk factors. Continue other healthy lifestyle choices and behaviors. Follow-up prevention exam in 1 year; follow-up office visit pending lab work.       Relevant Orders   Basic metabolic panel   CBC   Lipid panel   TSH   Hemoglobin A1c

## 2014-09-21 NOTE — Patient Instructions (Signed)
Thank you for choosing [facility].  Summary/Instructions:  Please stop by the lab on the basement level of the building for your blood work. Your results will be released to [facility] (or called to you) after review, usually within 72 hours after test completion. If any changes need to be made, you will be notified at that same time.  Health Maintenance Adopting a healthy lifestyle and getting preventive care can go a long way to promote health and wellness. Talk with your health care provider about what schedule of regular examinations is right for you. This is a good chance for you to check in with your provider about disease prevention and staying healthy. In between checkups, there are plenty of things you can do on your own. Experts have done a lot of research about which lifestyle changes and preventive measures are most likely to keep you healthy. Ask your health care provider for more information. WEIGHT AND DIET  Eat a healthy diet  Be sure to include plenty of vegetables, fruits, low-fat dairy products, and lean protein.  Do not eat a lot of foods high in solid fats, added sugars, or salt.  Get regular exercise. This is one of the most important things you can do for your health.  Most adults should exercise for at least 150 minutes each week. The exercise should increase your heart rate and make you sweat (moderate-intensity exercise).  Most adults should also do strengthening exercises at least twice a week. This is in addition to the moderate-intensity exercise.  Maintain a healthy weight  Body mass index (BMI) is a measurement that can be used to identify possible weight problems. It estimates body fat based on height and weight. Your health care provider can help determine your BMI and help you achieve or maintain a healthy weight.  For females 57 years of age and older:   A BMI below 18.5 is considered underweight.  A BMI of 18.5 to 24.9 is normal.  A BMI of 25  to 29.9 is considered overweight.  A BMI of 30 and above is considered obese.  Watch levels of cholesterol and blood lipids  You should start having your blood tested for lipids and cholesterol at 57 years of age, then have this test every 5 years.  You may need to have your cholesterol levels checked more often if:  Your lipid or cholesterol levels are high.  You are older than 57 years of age.  You are at high risk for heart disease.  CANCER SCREENING   Lung Cancer  Lung cancer screening is recommended for adults 35-21 years old who are at high risk for lung cancer because of a history of smoking.  A yearly low-dose CT scan of the lungs is recommended for people who:  Currently smoke  Have quit within the past 15 years.  Have at least a 30-pack-year history of smoking. A pack year is smoking an average of one pack of cigarettes a day for 1 year.  Yearly screening should continue until it has been 15 years since you quit.  Yearly screening should stop if you develop a health problem that would prevent you from having lung cancer treatment.  Breast Cancer  Practice breast self-awareness. This means understanding how your breasts normally appear and feel.  It also means doing regular breast self-exams. Let your health care provider know about any changes, no matter how small.  If you are in your 20s or 30s, you should have a clinical breast  exam (CBE) by a health care provider every 1-3 years as part of a regular health exam.  If you are 57 or older, have a CBE every year. Also consider having a breast X-ray (mammogram) every year.  If you have a family history of breast cancer, talk to your health care provider about genetic screening.  If you are at high risk for breast cancer, talk to your health care provider about having an MRI and a mammogram every year.  Breast cancer gene (BRCA) assessment is recommended for women who have family members with BRCA-related  cancers. BRCA-related cancers include:  Breast.  Ovarian.  Tubal.  Peritoneal cancers.  Results of the assessment will determine the need for genetic counseling and BRCA1 and BRCA2 testing. Cervical Cancer Routine pelvic examinations to screen for cervical cancer are no longer recommended for nonpregnant women who are considered low risk for cancer of the pelvic organs (ovaries, uterus, and vagina) and who do not have symptoms. A pelvic examination may be necessary if you have symptoms including those associated with pelvic infections. Ask your health care provider if a screening pelvic exam is right for you.   The Pap test is the screening test for cervical cancer for women who are considered at risk.  If you had a hysterectomy for a problem that was not cancer or a condition that could lead to cancer, then you no longer need Pap tests.  If you are older than 65 years, and you have had normal Pap tests for the past 10 years, you no longer need to have Pap tests.  If you have had past treatment for cervical cancer or a condition that could lead to cancer, you need Pap tests and screening for cancer for at least 20 years after your treatment.  If you no longer get a Pap test, assess your risk factors if they change (such as having a new sexual partner). This can affect whether you should start being screened again.  Some women have medical problems that increase their chance of getting cervical cancer. If this is the case for you, your health care provider may recommend more frequent screening and Pap tests.  The human papillomavirus (HPV) test is another test that may be used for cervical cancer screening. The HPV test looks for the virus that can cause cell changes in the cervix. The cells collected during the Pap test can be tested for HPV.  The HPV test can be used to screen women 64 years of age and older. Getting tested for HPV can extend the interval between normal Pap tests from  three to five years.  An HPV test also should be used to screen women of any age who have unclear Pap test results.  After 57 years of age, women should have HPV testing as often as Pap tests.  Colorectal Cancer  This type of cancer can be detected and often prevented.  Routine colorectal cancer screening usually begins at 57 years of age and continues through 57 years of age.  Your health care provider may recommend screening at an earlier age if you have risk factors for colon cancer.  Your health care provider may also recommend using home test kits to check for hidden blood in the stool.  A small camera at the end of a tube can be used to examine your colon directly (sigmoidoscopy or colonoscopy). This is done to check for the earliest forms of colorectal cancer.  Routine screening usually begins at age 67.  Direct examination of the colon should be repeated every 5-10 years through 57 years of age. However, you may need to be screened more often if early forms of precancerous polyps or small growths are found. Skin Cancer  Check your skin from head to toe regularly.  Tell your health care provider about any new moles or changes in moles, especially if there is a change in a mole's shape or color.  Also tell your health care provider if you have a mole that is larger than the size of a pencil eraser.  Always use sunscreen. Apply sunscreen liberally and repeatedly throughout the day.  Protect yourself by wearing long sleeves, pants, a wide-brimmed hat, and sunglasses whenever you are outside. HEART DISEASE, DIABETES, AND HIGH BLOOD PRESSURE   Have your blood pressure checked at least every 1-2 years. High blood pressure causes heart disease and increases the risk of stroke.  If you are between 23 years and 56 years old, ask your health care provider if you should take aspirin to prevent strokes.  Have regular diabetes screenings. This involves taking a blood sample to check  your fasting blood sugar level.  If you are at a normal weight and have a low risk for diabetes, have this test once every three years after 57 years of age.  If you are overweight and have a high risk for diabetes, consider being tested at a younger age or more often. PREVENTING INFECTION  Hepatitis B  If you have a higher risk for hepatitis B, you should be screened for this virus. You are considered at high risk for hepatitis B if:  You were born in a country where hepatitis B is common. Ask your health care provider which countries are considered high risk.  Your parents were born in a high-risk country, and you have not been immunized against hepatitis B (hepatitis B vaccine).  You have HIV or AIDS.  You use needles to inject street drugs.  You live with someone who has hepatitis B.  You have had sex with someone who has hepatitis B.  You get hemodialysis treatment.  You take certain medicines for conditions, including cancer, organ transplantation, and autoimmune conditions. Hepatitis C  Blood testing is recommended for:  Everyone born from [birth year] through 1963.  Anyone with known risk factors for hepatitis C. Sexually transmitted infections (STIs)  You should be screened for sexually transmitted infections (STIs) including gonorrhea and chlamydia if:  You are sexually active and are younger than 57 years of age.  You are older than 57 years of age and your health care provider tells you that you are at risk for this type of infection.  Your sexual activity has changed since you were last screened and you are at an increased risk for chlamydia or gonorrhea. Ask your health care provider if you are at risk.  If you do not have HIV, but are at risk, it may be recommended that you take a prescription medicine daily to prevent HIV infection. This is called pre-exposure prophylaxis (PrEP). You are considered at risk if:  You are sexually active and do not regularly use  condoms or know the HIV status of your partner(s).  You take drugs by injection.  You are sexually active with a partner who has HIV. Talk with your health care provider about whether you are at high risk of being infected with HIV. If you choose to begin PrEP, you should first be tested for HIV. You should then be  tested every 3 months for as long as you are taking PrEP.  PREGNANCY   If you are premenopausal and you may become pregnant, ask your health care provider about preconception counseling.  If you may become pregnant, take 400 to 800 micrograms (mcg) of folic acid every day.  If you want to prevent pregnancy, talk to your health care provider about birth control (contraception). OSTEOPOROSIS AND MENOPAUSE   Osteoporosis is a disease in which the bones lose minerals and strength with aging. This can result in serious bone fractures. Your risk for osteoporosis can be identified using a bone density scan.  If you are 96 years of age or older, or if you are at risk for osteoporosis and fractures, ask your health care provider if you should be screened.  Ask your health care provider whether you should take a calcium or vitamin D supplement to lower your risk for osteoporosis.  Menopause may have certain physical symptoms and risks.  Hormone replacement therapy may reduce some of these symptoms and risks. Talk to your health care provider about whether hormone replacement therapy is right for you.  HOME CARE INSTRUCTIONS   Schedule regular health, dental, and eye exams.  Stay current with your immunizations.   Do not use any tobacco products including cigarettes, chewing tobacco, or electronic cigarettes.  If you are pregnant, do not drink alcohol.  If you are breastfeeding, limit how much and how often you drink alcohol.  Limit alcohol intake to no more than 1 drink per day for nonpregnant women. One drink equals 12 ounces of beer, 5 ounces of wine, or 1 ounces of hard  liquor.  Do not use street drugs.  Do not share needles.  Ask your health care provider for help if you need support or information about quitting drugs.  Tell your health care provider if you often feel depressed.  Tell your health care provider if you have ever been abused or do not feel safe at home. Document Released: 09/16/2010 Document Revised: 07/18/2013 Document Reviewed: 02/02/2013 [facility] Patient Information 2015 [place], [place]. This information is not intended to replace advice given to you by your health care provider. Make sure you discuss any questions you have with your health care provider.

## 2014-09-21 NOTE — Assessment & Plan Note (Signed)
1) Anticipatory Guidance: Discussed importance of wearing a seatbelt while driving and not texting while driving; changing batteries in smoke detector at least once annually; wearing suntan lotion when outside; eating a balanced and moderate diet; getting physical activity at least 30 minutes per day.  2) Immunizations / Screenings / Labs:  Tetanus updated today. All other immunizations are up-to-date per recommendations. Due for a mammogram which will be scheduled independently. Due for a vision screen which will also be scheduled independently. All other screenings are up-to-date per recommendations. Obtain CBC, BMET, Lipid profile, A1c and TSH.   Overall well exam. Patient has cardiovascular risk factors including sedentary lifestyle and obesity. BMI is 42 indicating morbid obesity. Goal established  to lose 5-10% of current body weight through increasing nutrient density and decreasing saturated fats while also has increasing physical activity to 30 minutes most days of the week. Discussed importance of exercise and reducing cardiovascular risk factors. Continue other healthy lifestyle choices and behaviors. Follow-up prevention exam in 1 year; follow-up office visit pending lab work.

## 2014-09-21 NOTE — Progress Notes (Signed)
Pre visit review using our clinic review tool, if applicable. No additional management support is needed unless otherwise documented below in the visit note. 

## 2014-09-22 ENCOUNTER — Encounter: Payer: Self-pay | Admitting: Family

## 2014-10-25 ENCOUNTER — Other Ambulatory Visit: Payer: Self-pay | Admitting: Family

## 2014-10-25 DIAGNOSIS — Z1231 Encounter for screening mammogram for malignant neoplasm of breast: Secondary | ICD-10-CM

## 2014-11-28 ENCOUNTER — Ambulatory Visit (HOSPITAL_COMMUNITY): Payer: BLUE CROSS/BLUE SHIELD

## 2014-11-28 ENCOUNTER — Ambulatory Visit (HOSPITAL_COMMUNITY)
Admission: RE | Admit: 2014-11-28 | Discharge: 2014-11-28 | Disposition: A | Discharge status: Home / Self Care | Payer: BLUE CROSS/BLUE SHIELD | Source: Ambulatory Visit | Attending: Family | Admitting: Family

## 2014-11-28 DIAGNOSIS — Z1231 Encounter for screening mammogram for malignant neoplasm of breast: Secondary | ICD-10-CM | POA: Diagnosis present

## 2014-11-29 ENCOUNTER — Encounter: Payer: Self-pay | Admitting: Family

## 2015-04-18 ENCOUNTER — Encounter: Payer: Self-pay | Admitting: Nurse Practitioner

## 2015-04-18 ENCOUNTER — Ambulatory Visit (INDEPENDENT_AMBULATORY_CARE_PROVIDER_SITE_OTHER): Payer: BLUE CROSS/BLUE SHIELD | Admitting: Nurse Practitioner

## 2015-04-18 VITALS — BP 110/68 | HR 97 | Temp 100.0°F | Ht 62.0 in | Wt 209.0 lb

## 2015-04-18 DIAGNOSIS — R6889 Other general symptoms and signs: Secondary | ICD-10-CM | POA: Diagnosis not present

## 2015-04-18 LAB — POCT INFLUENZA A/B
Influenza A, POC: NEGATIVE
Influenza B, POC: POSITIVE — AB

## 2015-04-18 MED ORDER — OSELTAMIVIR PHOSPHATE 75 MG PO CAPS: 75.0000 mg | ORAL_CAPSULE | Freq: Two times a day (BID) | ORAL | Status: DC

## 2015-04-18 NOTE — Progress Notes (Signed)
Patient ID: Heidi Stevens, female    DOB: [birth year]  Age: 60 y.o. MRN: [id number]  CC: Flu Like Symptoms and Fever   HPI CHESLEY TURKNETT presents for CC of flu like symptoms that started Monday night.   1) Yesterday and today felt very bad she reports, she is accompanied by her husband who is an adjunct historian.  Tmax 101 Body aches, congestion   Throat sore Monday and yesterday, today the throat is improving Denies sick contacts and husband is well-appearing today Denies trying anything at home  History Silvia has a past medical history of H/O menorrhagia; Anemia; Fibroids; and H/O sinusitis.   She has past surgical history that includes Breast surgery (1985); arthrsocopy knee; G2P2; Wisdom tooth extraction (1980); and Total abdominal hysterectomy w/ bilateral salpingoophorectomy (07/06/07).   Her family history includes Asthma in her son; Breast cancer in her sister; Diabetes in her father, mother, and paternal grandmother; Hypertension in her father and mother; Liver cancer in her father; Stroke in her father and mother.She reports that she has quit smoking. She has never used smokeless tobacco. She reports that she does not drink alcohol or use illicit drugs.  Outpatient Prescriptions Prior to Visit  Medication Sig Dispense Refill  . esomeprazole (NEXIUM) 40 MG capsule Take 40 mg by mouth daily at 12 noon. Reported on 04/18/2015     No facility-administered medications prior to visit.    ROS Review of Systems  Constitutional: Negative for fever, chills, diaphoresis and fatigue.  HENT: Positive for congestion. Negative for sore throat.   Respiratory: Positive for cough. Negative for chest tightness, shortness of breath and wheezing.   Cardiovascular: Negative for chest pain, palpitations and leg swelling.  Gastrointestinal: Negative for nausea, vomiting and diarrhea.  Musculoskeletal: Positive for myalgias.  Skin: Negative for rash.  Neurological: Negative for dizziness and  headaches.   Objective:  BP 110/68 mmHg  Pulse 97  Temp(Src) 100 F (37.8 C) (Oral)  Ht 5\' 2"  (1.575 m)  Wt 209 lb (94.802 kg)  BMI 38.22 kg/m2  SpO2 97%  Physical Exam  Constitutional: She is oriented to person, place, and time. She appears well-developed and well-nourished.  Patient appears in discomfort  HENT:  Head: Normocephalic and atraumatic.  Right Ear: External ear normal.  Left Ear: External ear normal.  Mouth/Throat: No oropharyngeal exudate.  Eyes: Right eye exhibits no discharge. Left eye exhibits no discharge. No scleral icterus.  Neck: Normal range of motion. Neck supple.  Cardiovascular: Normal rate, regular rhythm and normal heart sounds.  Exam reveals no gallop and no friction rub.   No murmur heard. Pulmonary/Chest: Effort normal and breath sounds normal. No respiratory distress. She has no wheezes. She has no rales. She exhibits no tenderness.  Lymphadenopathy:    She has no cervical adenopathy.  Neurological: She is alert and oriented to person, place, and time.  Skin: Skin is warm and dry. No rash noted. She is not diaphoretic.  Psychiatric: She has a normal mood and affect. Her behavior is normal. Judgment and thought content normal.   Assessment & Plan:   Saint was seen today for flu like symptoms and fever.  Diagnoses and all orders for this visit:  Flu-like symptoms -     POCT Influenza A/B  Other orders -     oseltamivir (TAMIFLU) 75 MG capsule; Take 1 capsule (75 mg total) by mouth 2 (two) times daily.   I am having Ms. Hayner start on oseltamivir. I am also having her  maintain her esomeprazole.  Meds ordered this encounter  Medications  . oseltamivir (TAMIFLU) 75 MG capsule    Sig: Take 1 capsule (75 mg total) by mouth 2 (two) times daily.    Dispense:  10 capsule    Refill:  0    Order Specific Question:  Supervising Provider    Answer:  Crecencio Mc [[id number]]     Follow-up: Return if symptoms worsen or fail to improve.

## 2015-04-18 NOTE — Patient Instructions (Signed)

## 2015-04-18 NOTE — Progress Notes (Signed)
Pre visit review using our clinic review tool, if applicable. No additional management support is needed unless otherwise documented below in the visit note. 

## 2015-04-20 ENCOUNTER — Encounter: Payer: Self-pay | Admitting: Nurse Practitioner

## 2015-04-20 ENCOUNTER — Telehealth: Payer: Self-pay | Admitting: Nurse Practitioner

## 2015-04-20 NOTE — Telephone Encounter (Signed)
Faxed

## 2015-04-20 NOTE — Telephone Encounter (Signed)
Patient seen [facility] for a flu visit.  Patient states she needs letter stating what specific day she can come back to work.  Patient states she would like to go back to work this Monday the 6th.  She is requesting a letter to be faxed to her employer:  [phone] ATTN:  Mar Daring.

## 2015-04-22 DIAGNOSIS — R6889 Other general symptoms and signs: Secondary | ICD-10-CM | POA: Insufficient documentation

## 2015-04-22 NOTE — Assessment & Plan Note (Signed)
New onset Flulike symptoms 2 days Positive influenza B test in office Tamiflu given since within window 48 hours Supportive care at home Letter given to miss work for 5-7 days depending on need Advised on when to seek emergency care or seek follow-up care Pt verbalized understanding

## 2015-06-17 ENCOUNTER — Emergency Department (HOSPITAL_COMMUNITY)
Admission: EM | Admit: 2015-06-17 | Discharge: 2015-06-17 | Disposition: A | Discharge status: Home / Self Care | Payer: BLUE CROSS/BLUE SHIELD | Attending: Emergency Medicine | Admitting: Emergency Medicine

## 2015-06-17 ENCOUNTER — Encounter (HOSPITAL_COMMUNITY): Payer: Self-pay

## 2015-06-17 DIAGNOSIS — R252 Cramp and spasm: Secondary | ICD-10-CM | POA: Diagnosis not present

## 2015-06-17 DIAGNOSIS — R55 Syncope and collapse: Secondary | ICD-10-CM | POA: Insufficient documentation

## 2015-06-17 DIAGNOSIS — R61 Generalized hyperhidrosis: Secondary | ICD-10-CM | POA: Diagnosis not present

## 2015-06-17 DIAGNOSIS — Y9289 Other specified places as the place of occurrence of the external cause: Secondary | ICD-10-CM | POA: Insufficient documentation

## 2015-06-17 DIAGNOSIS — Y9389 Activity, other specified: Secondary | ICD-10-CM | POA: Diagnosis not present

## 2015-06-17 DIAGNOSIS — S01511A Laceration without foreign body of lip, initial encounter: Secondary | ICD-10-CM | POA: Diagnosis not present

## 2015-06-17 DIAGNOSIS — Z87891 Personal history of nicotine dependence: Secondary | ICD-10-CM | POA: Diagnosis not present

## 2015-06-17 DIAGNOSIS — S8992XA Unspecified injury of left lower leg, initial encounter: Secondary | ICD-10-CM | POA: Diagnosis not present

## 2015-06-17 DIAGNOSIS — W1839XA Other fall on same level, initial encounter: Secondary | ICD-10-CM | POA: Diagnosis not present

## 2015-06-17 DIAGNOSIS — R42 Dizziness and giddiness: Secondary | ICD-10-CM | POA: Insufficient documentation

## 2015-06-17 DIAGNOSIS — Y998 Other external cause status: Secondary | ICD-10-CM | POA: Diagnosis not present

## 2015-06-17 LAB — BASIC METABOLIC PANEL
ANION GAP: 10 (ref 5–15)
BUN: 10 mg/dL (ref 6–20)
CO2: 24 mmol/L (ref 22–32)
Calcium: 9 mg/dL (ref 8.9–10.3)
Chloride: 107 mmol/L (ref 101–111)
Creatinine, Ser: 1 mg/dL (ref 0.44–1.00)
Glucose, Bld: 100 mg/dL — ABNORMAL HIGH (ref 65–99)
POTASSIUM: 4.1 mmol/L (ref 3.5–5.1)
Sodium: 141 mmol/L (ref 135–145)

## 2015-06-17 LAB — CBC
HCT: 40.3 % (ref 36.0–46.0)
HEMOGLOBIN: 13.1 g/dL (ref 12.0–15.0)
MCH: 27.5 pg (ref 26.0–34.0)
MCHC: 32.5 g/dL (ref 30.0–36.0)
MCV: 84.7 fL (ref 78.0–100.0)
Platelets: 321 10*3/uL (ref 150–400)
RBC: 4.76 MIL/uL (ref 3.87–5.11)
RDW: 14.7 % (ref 11.5–15.5)
WBC: 4.7 10*3/uL (ref 4.0–10.5)

## 2015-06-17 LAB — I-STAT TROPONIN, ED: Troponin i, poc: 0 ng/mL (ref 0.00–0.08)

## 2015-06-17 NOTE — ED Notes (Signed)
Pt stated that she didn't want to wait any longer she would see her PCP in the morning.

## 2015-06-17 NOTE — ED Notes (Addendum)
Pt reports syncopal episode yesterday morning. She went to use restroom, sat down and left inner thigh cramped up. She stood up, became dizzy, lightheaded, diaphoretic and just passed out and fell to the ground., lac to bottom lip and has left knee pain. Her husband came to put her in the bed and she didn't remember her husband calling her name. She reports she still feels dizzy and feels like her eyes are swollen. A&Ox4, ambulatory.

## 2015-06-19 ENCOUNTER — Other Ambulatory Visit (INDEPENDENT_AMBULATORY_CARE_PROVIDER_SITE_OTHER): Payer: BLUE CROSS/BLUE SHIELD

## 2015-06-19 ENCOUNTER — Encounter: Payer: Self-pay | Admitting: Family

## 2015-06-19 ENCOUNTER — Ambulatory Visit (INDEPENDENT_AMBULATORY_CARE_PROVIDER_SITE_OTHER): Payer: BLUE CROSS/BLUE SHIELD | Admitting: Family

## 2015-06-19 VITALS — BP 120/80 | HR 66 | Temp 97.8°F | Resp 14 | Ht 62.0 in | Wt 211.0 lb

## 2015-06-19 DIAGNOSIS — R55 Syncope and collapse: Secondary | ICD-10-CM

## 2015-06-19 DIAGNOSIS — E860 Dehydration: Secondary | ICD-10-CM

## 2015-06-19 DIAGNOSIS — R42 Dizziness and giddiness: Secondary | ICD-10-CM

## 2015-06-19 DIAGNOSIS — R7989 Other specified abnormal findings of blood chemistry: Secondary | ICD-10-CM

## 2015-06-19 LAB — COMPREHENSIVE METABOLIC PANEL
ALBUMIN: 3.8 g/dL (ref 3.5–5.2)
ALK PHOS: 62 U/L (ref 39–117)
ALT: 8 U/L (ref 0–35)
AST: 13 U/L (ref 0–37)
BUN: 15 mg/dL (ref 6–23)
CALCIUM: 9.3 mg/dL (ref 8.4–10.5)
CHLORIDE: 104 meq/L (ref 96–112)
CO2: 31 mEq/L (ref 19–32)
Creatinine, Ser: 0.97 mg/dL (ref 0.40–1.20)
GFR: 75.96 mL/min (ref >60.00)
Glucose, Bld: 95 mg/dL (ref 70–99)
POTASSIUM: 4.3 meq/L (ref 3.5–5.1)
Sodium: 139 mEq/L (ref 135–145)
TOTAL PROTEIN: 7.8 g/dL (ref 6.0–8.3)
Total Bilirubin: 0.4 mg/dL (ref 0.2–1.2)

## 2015-06-19 LAB — CBC
HEMATOCRIT: 39 % (ref 36.0–46.0)
HEMOGLOBIN: 13 g/dL (ref 12.0–15.0)
MCHC: 33.3 g/dL (ref 30.0–36.0)
MCV: 82.4 fl (ref 78.0–100.0)
PLATELETS: 343 10*3/uL (ref 150.0–400.0)
RBC: 4.74 Mil/uL (ref 3.87–5.11)
RDW: 15.7 % — ABNORMAL HIGH (ref 11.5–15.5)
WBC: 5.7 10*3/uL (ref 4.0–10.5)

## 2015-06-19 LAB — D-DIMER, QUANTITATIVE: D-Dimer, Quant: 0.62 ug/mL-FEU — ABNORMAL HIGH (ref 0.00–0.48)

## 2015-06-19 LAB — TROPONIN I: TNIDX: 0 ug/L (ref 0.00–0.06)

## 2015-06-19 NOTE — Progress Notes (Signed)
Subjective:    Patient ID: Heidi Stevens, female    DOB: 1955-04-16, 60 y.o.   MRN: [id number]  Chief Complaint  Patient presents with  . Fall    states she went to the bathroom and got a cramp in her leg, when she got up she started sweating and felt light headed and woke up on the ground    HPI:  Heidi Stevens is a 60 y.o. female who  has a past medical history of H/O menorrhagia; Anemia; Fibroids; and H/O sinusitis. and presents today for an acute office visit.    This is a new problem. Associated symptom of possible syncope resulting in a fall occurred about 2 days ago. Describes when she went to the bathroom experiencing cramping in her leg and when she got up she started sweating and feeling lightheaded with the next thing she remembered being on the ground. She also was noted to have a laceration to the bottom lip and left knee pain. EKG in the emergency department showed sinus bradycardia. She was not seen by a provider as she left following the EKG. Since the initial onset she continues to feel lightheaded primarily when she changes position. Notes that it gradually goes away after changing positions. Denies fevers. Indicates she is not drinking water and consumes 3-4 cups of caffeine daily. Reports no problems with eating or symptoms of nausea, vomitting or diarrhea. Denies chest pain, shortness of breath or calf pain.    No Known Allergies   Current Outpatient Prescriptions on File Prior to Visit  Medication Sig Dispense Refill  . esomeprazole (NEXIUM) 40 MG capsule Take 40 mg by mouth daily at 12 noon. Reported on 04/18/2015     No current facility-administered medications on file prior to visit.     Past Surgical History  Procedure Laterality Date  . Breast surgery  1985    reduction mammoplsty  . Arthrsocopy knee      right knee in '04  . G2p2    . Wisdom tooth extraction  1980  . Total abdominal hysterectomy w/ bilateral salpingoophorectomy  07/06/07    Past  Medical History  Diagnosis Date  . H/O menorrhagia   . Anemia   . Fibroids   . H/O sinusitis      Review of Systems  Constitutional: Negative for fever and chills.  Respiratory: Negative for chest tightness and shortness of breath.   Cardiovascular: Negative for chest pain, palpitations and leg swelling.  Neurological: Positive for light-headedness. Negative for dizziness and weakness.      Objective:    BP 120/80 mmHg  Pulse 66  Temp(Src) 97.8 F (36.6 C) (Oral)  Resp 14  Ht '5\' 2"'  (1.575 m)  Wt 211 lb (95.709 kg)  BMI 38.58 kg/m2  SpO2 98% Nursing note and vital signs reviewed.  Physical Exam  Constitutional: She is oriented to person, place, and time. She appears well-developed and well-nourished. No distress.  HENT:  Right Ear: Hearing, tympanic membrane, external ear and ear canal normal.  Left Ear: Hearing, tympanic membrane, external ear and ear canal normal.  Nose: Nose normal.  Mouth/Throat: Uvula is midline, oropharynx is clear and moist and mucous membranes are normal.  Eyes: Conjunctivae are normal. Pupils are equal, round, and reactive to light.  Cardiovascular: Normal rate, regular rhythm, normal heart sounds and intact distal pulses.   Pulmonary/Chest: Effort normal and breath sounds normal.  Neurological: She is alert and oriented to person, place, and time.  Skin: Skin  is warm and dry.  Psychiatric: She has a normal mood and affect. Her behavior is normal. Judgment and thought content normal.       Assessment & Plan:   Problem List Items Addressed This Visit      Cardiovascular and Mediastinum   Syncopal episodes - Primary   Relevant Orders   CBC   Comp Met (CMET)   Troponin I   D-Dimer, Quantitative     Other   Lightheadedness    Lightheadedness most likely related to dehydration and caffeine intake. Previous EKG reviewed shows sinus bradycardia. Obtain complete metabolic panel, CBC, d-dimer, and troponin. Unlikely cardiac origin, however  symptoms continue consider 2-D echocardiogram. Follow-up if symptoms worsen or do not improve.      Relevant Orders   CBC   Comp Met (CMET)   Troponin I   D-Dimer, Quantitative   Dehydration   Relevant Orders   CBC   Comp Met (CMET)       I have discontinued Ms. Hovater's oseltamivir. I am also having her maintain her esomeprazole.   No orders of the defined types were placed in this encounter.     Follow-up: Return if symptoms worsen or fail to improve.  Mauricio Po, FNP

## 2015-06-19 NOTE — Patient Instructions (Signed)
Thank you for choosing [facility].  Summary/Instructions:  Please continue to take your medications as prescribed.  Please increase your water intake and decrease your caffeine intake.   Please continue to take your medications as prescribed.   Change positions slowly.  Please stop by the lab on the basement level of the building for your blood work. Your results will be released to [facility] (or called to you) after review, usually within 72 hours after test completion. If any changes need to be made, you will be notified at that same time.  If your symptoms worsen or fail to improve, please contact our office for further instruction, or in case of emergency go directly to the emergency room at the closest medical facility.   Dizziness Dizziness is a common problem. It is a feeling of unsteadiness or light-headedness. You may feel like you are about to faint. Dizziness can lead to injury if you stumble or fall. Anyone can become dizzy, but dizziness is more common in older adults. This condition can be caused by a number of things, including medicines, dehydration, or illness. HOME CARE INSTRUCTIONS Taking these steps may help with your condition: Eating and Drinking  Drink enough fluid to keep your urine clear or pale yellow. This helps to keep you from becoming dehydrated. Try to drink more clear fluids, such as water.  Do not drink alcohol.  Limit your caffeine intake if directed by your health care provider.  Limit your salt intake if directed by your health care provider. Activity  Avoid making quick movements.  Rise slowly from chairs and steady yourself until you feel okay.  In the morning, first sit up on the side of the bed. When you feel okay, stand slowly while you hold onto something until you know that your balance is fine.  Move your legs often if you need to stand in one place for a long time. Tighten and relax your muscles in your legs while you are  standing.  Do not drive or operate heavy machinery if you feel dizzy.  Avoid bending down if you feel dizzy. Place items in your home so that they are easy for you to reach without leaning over. Lifestyle  Do not use any tobacco products, including cigarettes, chewing tobacco, or electronic cigarettes. If you need help quitting, ask your health care provider.  Try to reduce your stress level, such as with yoga or meditation. Talk with your health care provider if you need help. General Instructions  Watch your dizziness for any changes.  Take medicines only as directed by your health care provider. Talk with your health care provider if you think that your dizziness is caused by a medicine that you are taking.  Tell a friend or a family member that you are feeling dizzy. If he or she notices any changes in your behavior, have this person call your health care provider.  Keep all follow-up visits as directed by your health care provider. This is important. SEEK MEDICAL CARE IF:  Your dizziness does not go away.  Your dizziness or light-headedness gets worse.  You feel nauseous.  You have reduced hearing.  You have new symptoms.  You are unsteady on your feet or you feel like the room is spinning. SEEK IMMEDIATE MEDICAL CARE IF:  You vomit or have diarrhea and are unable to eat or drink anything.  You have problems talking, walking, swallowing, or using your arms, hands, or legs.  You feel generally weak.  You  are not thinking clearly or you have trouble forming sentences. It may take a friend or family member to notice this.  You have chest pain, abdominal pain, shortness of breath, or sweating.  Your vision changes.  You notice any bleeding.  You have a headache.  You have neck pain or a stiff neck.  You have a fever.   This information is not intended to replace advice given to you by your health care provider. Make sure you discuss any questions you have with  your health care provider.   Document Released: 08/27/2000 Document Revised: 07/18/2014 Document Reviewed: 02/27/2014 Elsevier Interactive Patient Education [employer].

## 2015-06-19 NOTE — Assessment & Plan Note (Signed)
Lightheadedness most likely related to dehydration and caffeine intake. Previous EKG reviewed shows sinus bradycardia. Obtain complete metabolic panel, CBC, d-dimer, and troponin. Unlikely cardiac origin, however symptoms continue consider 2-D echocardiogram. Follow-up if symptoms worsen or do not improve.

## 2015-06-19 NOTE — Progress Notes (Signed)
Pre visit review using our clinic review tool, if applicable. No additional management support is needed unless otherwise documented below in the visit note. 

## 2015-06-21 ENCOUNTER — Ambulatory Visit (HOSPITAL_COMMUNITY)
Admission: RE | Admit: 2015-06-21 | Discharge: 2015-06-21 | Disposition: A | Discharge status: Home / Self Care | Payer: BLUE CROSS/BLUE SHIELD | Source: Ambulatory Visit | Attending: Cardiovascular Disease | Admitting: Cardiovascular Disease

## 2015-06-21 DIAGNOSIS — R791 Abnormal coagulation profile: Secondary | ICD-10-CM | POA: Insufficient documentation

## 2015-06-21 DIAGNOSIS — M79604 Pain in right leg: Secondary | ICD-10-CM | POA: Insufficient documentation

## 2015-06-21 DIAGNOSIS — E669 Obesity, unspecified: Secondary | ICD-10-CM | POA: Insufficient documentation

## 2015-06-21 DIAGNOSIS — Z6838 Body mass index (BMI) 38.0-38.9, adult: Secondary | ICD-10-CM | POA: Insufficient documentation

## 2015-06-21 DIAGNOSIS — R7989 Other specified abnormal findings of blood chemistry: Secondary | ICD-10-CM

## 2015-06-24 ENCOUNTER — Encounter: Payer: Self-pay | Admitting: Family

## 2015-08-03 ENCOUNTER — Ambulatory Visit (INDEPENDENT_AMBULATORY_CARE_PROVIDER_SITE_OTHER): Payer: BLUE CROSS/BLUE SHIELD | Admitting: Family

## 2015-08-03 ENCOUNTER — Encounter: Payer: Self-pay | Admitting: Family

## 2015-08-03 VITALS — BP 112/80 | HR 56 | Temp 97.8°F | Resp 16 | Ht 62.0 in | Wt 208.0 lb

## 2015-08-03 DIAGNOSIS — M7042 Prepatellar bursitis, left knee: Secondary | ICD-10-CM | POA: Diagnosis not present

## 2015-08-03 DIAGNOSIS — M7040 Prepatellar bursitis, unspecified knee: Secondary | ICD-10-CM | POA: Insufficient documentation

## 2015-08-03 MED ORDER — DICLOFENAC SODIUM 2 % TD SOLN: 1.0000 "application " | Freq: Two times a day (BID) | TRANSDERMAL | Status: DC | PRN

## 2015-08-03 MED ORDER — NAPROXEN-ESOMEPRAZOLE 500-20 MG PO TBEC: 1.0000 | DELAYED_RELEASE_TABLET | Freq: Two times a day (BID) | ORAL | Status: DC | PRN

## 2015-08-03 NOTE — Progress Notes (Signed)
Subjective:    Patient ID: Heidi Stevens, female    DOB: [birth year], 60 y.o.   MRN: [id number]  Chief Complaint  Patient presents with  . Fall    fell in april and hit her left knee, left it alone for a while and got on her knee at work and it hurt so bad that she about passed out    HPI:  Heidi Stevens is a 60 y.o. female who  has a past medical history of H/O menorrhagia; Anemia; Fibroids; and H/O sinusitis. and presents today For an acute office visit.  This is a new problem. Associated symptom of pain located in her left knee has been going on for approximately one month following a fall. Describes that she got on her knee at work and it hurt so bad that she nearly passed out. Pain is described as "hurt". Modifying factors include ice and elevation which have not helped very much. The course of the symptoms has not improved.   No Known Allergies   Current Outpatient Prescriptions on File Prior to Visit  Medication Sig Dispense Refill  . esomeprazole (NEXIUM) 40 MG capsule Take 40 mg by mouth daily at 12 noon. Reported on 04/18/2015     No current facility-administered medications on file prior to visit.     Past Surgical History  Procedure Laterality Date  . Breast surgery  1985    reduction mammoplsty  . Arthrsocopy knee      right knee in '04  . G2p2    . Wisdom tooth extraction  1980  . Total abdominal hysterectomy w/ bilateral salpingoophorectomy  07/06/07     Review of Systems  Musculoskeletal:       Positive for knee pain  Neurological: Negative for weakness and numbness.      Objective:    BP 112/80 mmHg  Pulse 56  Temp(Src) 97.8 F (36.6 C) (Oral)  Resp 16  Ht 5\' 2"  (1.575 m)  Wt 208 lb (94.348 kg)  BMI 38.03 kg/m2  SpO2 97% Nursing note and vital signs reviewed.  Physical Exam  Constitutional: She is oriented to person, place, and time. She appears well-developed and well-nourished. No distress.  Cardiovascular: Normal rate, regular rhythm,  normal heart sounds and intact distal pulses.   Pulmonary/Chest: Effort normal and breath sounds normal.  Neurological: She is alert and oriented to person, place, and time.  Skin: Skin is warm and dry.  Psychiatric: She has a normal mood and affect. Her behavior is normal. Judgment and thought content normal.   Examination: Ultrasound of knee Date:  08/03/2015 Patient Name: Heidi Stevens History: Fall x 1 month ago with left knee pain. Findings:  The extensor mechanism, including the quadriceps tendon, patella, and patellar tendon is normal hypoechoic changes located in the pre-patellar area consistent with bursitis. No significant joint effusion or synovial hypertrophy. The medial collateral and lateral collateral ligaments are normal. Unremarkable iliotibial tract, biceps femoris, popliteus tendon, and common peroneal nerve. No Baker cyst. Limited evaluation of the menisci is unremarkable.  Impression: Prepatellar inflammation consistent with bursitis.        All images are located under the media tab. [place] ordered, performed and interpreted by Terri Piedra, FNP      Assessment & Plan:   Problem List Items Addressed This Visit      Musculoskeletal and Integument   Prepatellar bursitis - Primary    Symptoms and exam consistent with prepatellar bursitis/tendinitis as seen on ultrasound most likely  related to the previous fall. Treat conservatively with Pennsaid and naproxen-esomeprazole. Ice and home exercise therapy. Follow up in 3 weeks if symptoms worsen or do not improve.       Relevant Medications   Diclofenac Sodium (PENNSAID) 2 % SOLN   Naproxen-Esomeprazole 500-20 MG TBEC       I am having Heidi Stevens start on Diclofenac Sodium and Naproxen-Esomeprazole. I am also having her maintain her esomeprazole.   Meds ordered this encounter  Medications  . Diclofenac Sodium (PENNSAID) 2 % SOLN    Sig: Place 1 application onto the skin 2 (two) times daily as needed.    Dispense:   112 g    Refill:  1    Order Specific Question:  Supervising Provider    Answer:  Pricilla Holm A [id number]  . Naproxen-Esomeprazole 500-20 MG TBEC    Sig: Take 1 tablet by mouth 2 (two) times daily as needed.    Dispense:  60 tablet    Refill:  0    Order Specific Question:  Supervising Provider    Answer:  Pricilla Holm A [id number]     Follow-up: Return in about 3 weeks (around 08/24/2015), or if symptoms worsen or fail to improve.  Mauricio Po, FNP

## 2015-08-03 NOTE — Progress Notes (Signed)
Pre visit review using our clinic review tool, if applicable. No additional management support is needed unless otherwise documented below in the visit note. 

## 2015-08-03 NOTE — Assessment & Plan Note (Signed)
Symptoms and exam consistent with prepatellar bursitis/tendinitis as seen on ultrasound most likely related to the previous fall. Treat conservatively with Pennsaid and naproxen-esomeprazole. Ice and home exercise therapy. Follow up in 3 weeks if symptoms worsen or do not improve.

## 2015-08-03 NOTE — Patient Instructions (Addendum)
Thank you for choosing [facility].  Summary/Instructions:  Ice 2-3 times per day and after activity as needed.  Stretching and exercises daily.  Vimovo 2x per day for 5-7 days and then as needed.  Pennsaid - 2x per day about 1/2 packet to the effected area   Your prescription(s) have been submitted to your pharmacy or been printed and provided for you. Please take as directed and contact our office if you believe you are having problem(s) with the medication(s) or have any questions.  If your symptoms worsen or fail to improve, please contact our office for further instruction, or in case of emergency go directly to the emergency room at the closest medical facility.   Patellar Tendinitis With Rehab Tendinitis is inflammation of a tendon. Tendonitis of the tendon below the kneecap (patella) is known as patellar tendonitis. Patellar tendonitis is also called jumper's knee. Jumper's knee is a common cause of pain below the kneecap (infrapatellar). Jumper's knee may involve a tear (strain) in the ligament. Strains are classified into three categories. Grade 1 strains cause pain, but the tendon is not lengthened. Grade 2 strains include a lengthened ligament, due to the ligament being stretched or partially ruptured. With grade 2 strains there is still function, although function may be decreased. Grade 3 strains involve a complete tear of the tendon or muscle, and function is usually impaired. Patellar tendon strains are usually grade 1 or 2.  SYMPTOMS   Pain, tenderness, swelling, warmth, or redness over the patellar tendon (just below the kneecap).  Pain and loss of strength (sometimes), with forcefully straightening the knee (especially when jumping or rising from a seated or squatting position), or bending the knee completely (squatting or kneeling).  Crackling sound (crepitation) when the tendon is moved or touched. CAUSES  Patellar tendonitis is caused by injury to the patellar  tendon. The inflammation is the body's healing response. Common causes of injury include:  Stress from a sudden increase in intensity, frequency, or duration of training.  Overuse of the thigh muscles (quadriceps) and patellar tendon.  Direct hit (trauma) to the knee or patellar tendon. RISK INCREASES WITH:  Sports that require sudden, explosive quadriceps contraction, such as jumping, quick starts, or kicking.  Running sports, especially running down hills.  Poor strength and flexibility of the thigh and knee.  Flat feet. PREVENTION  Warm up and stretch properly before activity.  Allow for adequate recovery between workouts.  Maintain physical fitness:  Strength, flexibility, and endurance.  Cardiovascular fitness.  Protect the knee joint with taping, protective strapping, bracing, or elastic compression bandage.  Wear arch supports (orthotics). PROGNOSIS  If treated properly, patellar tendonitis usually heals within 6 weeks.  RELATED COMPLICATIONS   Longer healing time if not properly treated or if not given enough time to heal.  Recurring symptoms if activity is resumed too soon, with overuse, with a direct blow, or when using poor technique.  If untreated, tendon rupture requiring surgery. TREATMENT Treatment first involves the use of ice and medicine to reduce pain and inflammation. The use of strengthening and stretching exercises may help reduce pain with activity. These exercises may be performed at home or with a therapist. Serious cases of tendonitis may require restraining the knee for 10 to 14 days to prevent stress on the tendon and to promote healing. Crutches may be used (uncommon) until you can walk without a limp. For cases in which nonsurgical treatment is unsuccessful, surgery may be advised to remove the inflamed tendon  lining (sheath). Surgery is rare, and is only advised after at least 6 months of nonsurgical treatment. MEDICATION   If pain medicine  is needed, nonsteroidal anti-inflammatory medicines (aspirin and ibuprofen), or other minor pain relievers (acetaminophen), are often advised.  Do not take pain medicine for 7 days before surgery.  Prescription pain relievers may be given if your caregiver thinks they are needed. Use only as directed and only as much as you need. HEAT AND COLD  Cold treatment (icing) should be applied for 10 to 15 minutes every 2 to 3 hours for inflammation and pain, and immediately after activity that aggravates your symptoms. Use ice packs or an ice massage.  Heat treatment may be used before performing stretching and strengthening activities prescribed by your caregiver, physical therapist, or athletic trainer. Use a heat pack or a warm water soak. SEEK MEDICAL CARE IF:  Symptoms get worse or do not improve in 2 weeks, despite treatment.  New, unexplained symptoms develop. (Drugs used in treatment may produce side effects.) EXERCISES RANGE OF MOTION (ROM) AND STRETCHING EXERCISES - Patellar Tendinitis (Jumper's Knee) These are some of the initial exercises with which you may start your rehabilitation program, until you see your caregiver again or until your symptoms are resolved. Remember:   Flexible tissue is more tolerant of the stresses placed on it during activities.  Each stretch should be held for 20 to 30 seconds.  A gentle stretching sensation should be felt. STRETCH - Hamstrings, Supine  Lie on your back. Loop a belt or towel over the ball of your right / left foot.  Straighten your right / left knee and slowly pull on the belt to raise your leg. Do not allow the right / left knee to bend. Keep your opposite leg flat on the floor.  Raise the leg until you feel a gentle stretch behind your right / left knee or thigh. Hold this position for __________ seconds. Repeat __________ times. Complete this stretch __________ times per day.  STRETCH - Hamstrings, Doorway  Lie on your back with your  right / left leg extended and resting on the wall, and the opposite leg flat on the ground through the door. At first, position your bottom farther away from the wall.  Keep your right / left knee straight. If you feel a stretch behind your knee or thigh, hold this position for __________ seconds.  If you do not feel a stretch, scoot your bottom closer to the door, and hold __________ seconds. Repeat __________ times. Complete this stretch __________ times per day.  STRETCH - Hamstrings, Standing  Stand or sit and extend your right / left leg, placing your foot on a chair or foot stool.  Keep a slight arch in your low back and your hips straight forward.  Lead with your chest and lean forward at the waist until you feel a gentle stretch in the back of your right / left knee or thigh. (When done correctly, this exercise requires leaning only a small distance.)  Hold this position for __________ seconds. Repeat __________ times. Complete this stretch __________ times per day. STRETCH - Adductors, Lunge  While standing, spread your legs, with your right / left leg behind you.  Lean away from your right / left leg by bending your opposite knee. You may rest your hands on your thigh for balance.  You should feel a stretch in your right / left inner thigh. Hold for __________ seconds. Repeat __________ times. Complete this exercise  __________ times per day.  STRENGTHENING EXERCISES - Patellar Tendinitis (Jumper's Knee) These exercises may help you when beginning to rehabilitate your injury. They may resolve your symptoms with or without further involvement from your physician, physical therapist or athletic trainer. While completing these exercises, remember:   Muscles can gain both the endurance and the strength needed for everyday activities through controlled exercises.  Complete these exercises as instructed by your physician, physical therapist or athletic trainer. Increase the resistance  and repetitions only as guided by your caregiver. STRENGTH - Quadriceps, Isometrics  Lie on your back with your right / left leg extended and your opposite knee bent.  Gradually tense the muscles in the front of your right / left thigh. You should see either your kneecap slide up toward your hip or increased dimpling just above the knee. This motion will push the back of the knee down toward the floor, mat, or bed on which you are lying.  Hold the muscle as tight as you can, without increasing your pain, for __________ seconds.  Relax the muscles slowly and completely in between each repetition. Repeat __________ times. Complete this exercise __________ times per day.  STRENGTH - Quadriceps, Short Arcs  Lie on your back. Place a __________ inch towel roll under your right / left knee, so that the knee bends slightly.  Raise only your lower leg by tightening the muscles in the front of your thigh. Do not allow your thigh to rise.  Hold this position for __________ seconds. Repeat __________ times. Complete this exercise __________ times per day.  OPTIONAL ANKLE WEIGHTS: Begin with ____________________, but DO NOT exceed ____________________. Increase in 1 pound/ 0.5 kilogram increments. STRENGTH - Quadriceps, Straight Leg Raises  Quality counts! Watch for signs that the quadriceps muscle is working, to be sure you are strengthening the correct muscles and not "cheating" by substituting with healthier muscles.  Lay on your back with your right / left leg extended and your opposite knee bent.  Tense the muscles in the front of your right / left thigh. You should see either your kneecap slide up or increased dimpling just above the knee. Your thigh may even shake a bit.  Tighten these muscles even more and raise your leg 4 to 6 inches off the floor. Hold for __________ seconds.  Keeping these muscles tense, lower your leg.  Relax the muscles slowly and completely between each  repetition. Repeat __________ times. Complete this exercise __________ times per day.  STRENGTH - Quadriceps, Squats  Stand in a door frame so that your feet and knees are in line with the frame.  Use your hands for balance, not support, on the frame.  Slowly lower your weight, bending at the hips and knees. Keep your lower legs upright so that they are parallel with the door frame. Squat only within the range that does not increase your knee pain. Never let your hips drop below your knees.  Slowly return upright, pushing with your legs, not pulling with your hands. Repeat __________ times. Complete this exercise __________ times per day.  STRENGTH - Quadriceps, Step-Downs  Stand on the edge of a step stool or stair. Be prepared to use a countertop or wall for balance, if needed.  Keeping your right / left knee directly over the middle of your foot, slowly touch your opposite heel to the floor or lower step. Do not go all the way to the floor if your knee pain increases; just go as far as  you can without increased discomfort. Use your right / left leg muscles, not gravity to lower your body weight.  Slowly push your body weight back up to the starting position. Repeat __________ times. Complete this exercise __________ times per day.    This information is not intended to replace advice given to you by your health care provider. Make sure you discuss any questions you have with your health care provider.   Document Released: 03/03/2005 Document Revised: 07/18/2014 Document Reviewed: 06/15/2008 Elsevier Interactive Patient Education [employer].

## 2015-11-13 DIAGNOSIS — Z23 Encounter for immunization: Secondary | ICD-10-CM | POA: Diagnosis not present

## 2016-01-07 ENCOUNTER — Emergency Department (HOSPITAL_COMMUNITY): Payer: BLUE CROSS/BLUE SHIELD

## 2016-01-07 ENCOUNTER — Emergency Department (HOSPITAL_COMMUNITY)
Admission: EM | Admit: 2016-01-07 | Discharge: 2016-01-07 | Disposition: A | Discharge status: Home / Self Care | Payer: BLUE CROSS/BLUE SHIELD | Attending: Emergency Medicine | Admitting: Emergency Medicine

## 2016-01-07 ENCOUNTER — Encounter (HOSPITAL_COMMUNITY): Payer: Self-pay | Admitting: Family Medicine

## 2016-01-07 DIAGNOSIS — S4991XA Unspecified injury of right shoulder and upper arm, initial encounter: Secondary | ICD-10-CM | POA: Diagnosis not present

## 2016-01-07 DIAGNOSIS — Y999 Unspecified external cause status: Secondary | ICD-10-CM | POA: Diagnosis not present

## 2016-01-07 DIAGNOSIS — M25511 Pain in right shoulder: Secondary | ICD-10-CM

## 2016-01-07 DIAGNOSIS — Y9301 Activity, walking, marching and hiking: Secondary | ICD-10-CM | POA: Diagnosis not present

## 2016-01-07 DIAGNOSIS — Z87891 Personal history of nicotine dependence: Secondary | ICD-10-CM | POA: Insufficient documentation

## 2016-01-07 DIAGNOSIS — X58XXXA Exposure to other specified factors, initial encounter: Secondary | ICD-10-CM | POA: Insufficient documentation

## 2016-01-07 DIAGNOSIS — Z79899 Other long term (current) drug therapy: Secondary | ICD-10-CM | POA: Insufficient documentation

## 2016-01-07 MED ORDER — LIDOCAINE 5 % EX PTCH
1.0000 | MEDICATED_PATCH | CUTANEOUS | Status: DC
  Administered 2016-01-07: 1 via TRANSDERMAL
  Filled 2016-01-07: qty 1

## 2016-01-07 MED ORDER — METHOCARBAMOL 500 MG PO TABS: 500.0000 mg | ORAL_TABLET | Freq: Three times a day (TID) | ORAL | 0 refills | Status: DC | PRN

## 2016-01-07 MED ORDER — LIDOCAINE 5 % EX PTCH: 1.0000 | MEDICATED_PATCH | CUTANEOUS | 0 refills | Status: DC

## 2016-01-07 NOTE — Discharge Instructions (Signed)
°

## 2016-01-07 NOTE — ED Notes (Signed)
Pt ambulatory and independent at discharge.  Verbalized understanding of discharge instructions 

## 2016-01-07 NOTE — ED Triage Notes (Signed)
Patient is complaining of left shoulder pain. Pt reports yesterday she was walking her dog and the dog snatched patient. Then, working yesterday afternoon with some lifting and moving shopping carts. This morning she woke up with pain.

## 2016-01-07 NOTE — ED Provider Notes (Signed)
[name]   CSN: [id number] Arrival date & time: 01/07/16  1939   By signing my name below, I, Evelene Croon, attest that this documentation has been prepared under the direction and in the presence of non-physician practitioner, Clayton Bibles, PA-C. Electronically Signed: Evelene Croon, Scribe. 01/07/2016. 9:00 PM.   History   Chief Complaint Chief Complaint  Patient presents with  . Shoulder Pain   The history is provided by the patient. No language interpreter was used.     HPI Comments:  Heidi Stevens is a 60 y.o. female who presents to the Emergency Department complaining of nagging pain to the right shoulder pain; increased since waking this AM. Pt notes her dog yanked her yesterday before she was able to tighten up the dog leash. She also notes mild lifting and moving shopping carts yesterday as well. She denies neck pain. She has taken tylenol with little relief. Denies any other injury.  Denies numbness of the right hand.    Past Medical History:  Diagnosis Date  . Anemia   . Fibroids   . H/O menorrhagia   . H/O sinusitis     Patient Active Problem List   Diagnosis Date Noted  . Prepatellar bursitis 08/03/2015  . Syncopal episodes 06/19/2015  . Lightheadedness 06/19/2015  . Dehydration 06/19/2015  . Flu-like symptoms 04/22/2015  . Routine general medical examination at a health care facility 09/21/2014  . Primary localized osteoarthrosis, lower leg 07/11/2013  . IT band syndrome 07/11/2013  . Obesity, Class II, BMI 35-39.9 05/31/2013  . Acute meniscal tear of knee, subsequent encounter 04/27/2013  . Anemia   . Fibroids   . H/O sinusitis   . GERD (gastroesophageal reflux disease) 10/24/2010  . Healthcare maintenance 10/24/2010  . ALLERGIC RHINITIS CAUSE UNSPECIFIED 07/03/2009    Past Surgical History:  Procedure Laterality Date  . arthrsocopy knee     right knee in '04  . BREAST SURGERY  1985   reduction mammoplsty  . G2P2    .  TOTAL ABDOMINAL HYSTERECTOMY W/ BILATERAL SALPINGOOPHORECTOMY  07/06/07  . WISDOM TOOTH EXTRACTION  1980    OB History    Gravida Para Term Preterm AB Living   2 2       2    SAB TAB Ectopic Multiple Live Births                   Home Medications    Prior to Admission medications   Medication Sig Start Date End Date Taking? Authorizing Provider  Diclofenac Sodium (PENNSAID) 2 % SOLN Place 1 application onto the skin 2 (two) times daily as needed. 08/03/15   Golden Circle, FNP  esomeprazole (NEXIUM) 40 MG capsule Take 40 mg by mouth daily at 12 noon. Reported on 04/18/2015    Historical Provider, MD  Naproxen-Esomeprazole 500-20 MG TBEC Take 1 tablet by mouth 2 (two) times daily as needed. 08/03/15   Golden Circle, FNP    Family History Family History  Problem Relation Age of Onset  . Diabetes Mother   . Hypertension Mother   . Stroke Mother   . Hypertension Father   . Diabetes Father   . Stroke Father   . Liver cancer Father   . Diabetes Paternal Grandmother   . Asthma Son   . Breast cancer Sister     Social History Social History  Substance Use Topics  . Smoking status: Former Research scientist (life sciences)  . Smokeless tobacco: Never Used  . Alcohol use  No     Allergies   Review of patient's allergies indicates no known allergies.   Review of Systems Review of Systems  Constitutional: Negative for fever.  Musculoskeletal: Positive for arthralgias (shoulder pain). Negative for back pain and neck pain.  Skin: Negative for color change, pallor and wound.  Neurological: Negative for weakness and numbness.  Hematological: Does not bruise/bleed easily.  Psychiatric/Behavioral: Negative for self-injury.    Physical Exam Updated Vital Signs BP 136/85 (BP Location: Left Arm)   Pulse 63   Temp 98.4 F (36.9 C) (Oral)   Resp 18   Ht 5\' 2"  (1.575 m)   Wt 95.3 kg   SpO2 99%   BMI 38.41 kg/m   Physical Exam  Constitutional: She appears well-developed and well-nourished. No  distress.  HENT:  Head: Normocephalic and atraumatic.  Neck: Neck supple.  Pulmonary/Chest: Effort normal.  Musculoskeletal: She exhibits tenderness.  RUE: tenderness to anterior shoulder; decreased active ROM secondary to pain; pt unable to engage in drop arm test secondary to pain; no other focal tenderness of the RUE No cervical spine tenderness Distal pulses and sensation intact Hand temperature is the same as the left    Neurological: She is alert.  Skin: She is not diaphoretic.  Nursing note and vitals reviewed.    ED Treatments / Results  DIAGNOSTIC STUDIES:  Oxygen Saturation is 99% on RA, normal by my interpretation.    COORDINATION OF CARE:  8:39 PM Discussed treatment plan with pt at bedside and pt agreed to plan.  Labs (all labs ordered are listed, but only abnormal results are displayed) Labs Reviewed - No data to display  EKG  EKG Interpretation None       Radiology No results found.  Procedures Procedures (including critical care time)  Medications Ordered in ED Medications  lidocaine (LIDODERM) 5 % 1 patch (1 patch Transdermal Patch Applied 01/07/16 2116)     Initial Impression / Assessment and Plan / ED Course  I have reviewed the triage vital signs and the nursing notes.  Pertinent labs & imaging results that were available during my care of the patient were reviewed by me and considered in my medical decision making (see chart for details).  Clinical Course    Afebrile, nontoxic patient with injury to her right shoulder while being pulled by her pit bull.  Neurovascularly intact.  Tenderness to anterior shoulder.   Xray negative.   D/C home with sling, medication, orthopedic follow up.  Discussed result, findings, treatment, and follow up  with patient.  Pt given return precautions.  Pt verbalizes understanding and agrees with plan.      Final Clinical Impressions(s) / ED Diagnoses   Final diagnoses:  Acute pain of right shoulder     New Prescriptions New Prescriptions   No medications on file   I personally performed the services described in this documentation, which was scribed in my presence. The recorded information has been reviewed and is accurate.     Clayton Bibles, PA-C 01/07/16 2211    Merrily Pew, MD 01/08/16 [phone]

## 2016-01-09 DIAGNOSIS — S43491A Other sprain of right shoulder joint, initial encounter: Secondary | ICD-10-CM | POA: Diagnosis not present

## 2016-01-10 ENCOUNTER — Inpatient Hospital Stay: Payer: BLUE CROSS/BLUE SHIELD | Admitting: Family

## 2016-06-26 ENCOUNTER — Encounter: Payer: Self-pay | Admitting: Family Medicine

## 2016-06-26 ENCOUNTER — Ambulatory Visit (INDEPENDENT_AMBULATORY_CARE_PROVIDER_SITE_OTHER): Payer: BLUE CROSS/BLUE SHIELD | Admitting: Family Medicine

## 2016-06-26 VITALS — BP 120/80 | HR 60 | Temp 98.2°F | Wt 208.8 lb

## 2016-06-26 DIAGNOSIS — J069 Acute upper respiratory infection, unspecified: Secondary | ICD-10-CM

## 2016-06-26 DIAGNOSIS — R05 Cough: Secondary | ICD-10-CM

## 2016-06-26 DIAGNOSIS — R059 Cough, unspecified: Secondary | ICD-10-CM

## 2016-06-26 DIAGNOSIS — J309 Allergic rhinitis, unspecified: Secondary | ICD-10-CM

## 2016-06-26 MED ORDER — PREDNISONE 10 MG PO TABS: ORAL_TABLET | ORAL | 0 refills | Status: DC

## 2016-06-26 MED ORDER — BENZONATATE 100 MG PO CAPS: 100.0000 mg | ORAL_CAPSULE | Freq: Three times a day (TID) | ORAL | 0 refills | Status: DC

## 2016-06-26 MED ORDER — FLUTICASONE PROPIONATE 50 MCG/ACT NA SUSP: 2.0000 | Freq: Every day | NASAL | 6 refills | Status: DC

## 2016-06-26 NOTE — Progress Notes (Signed)
Pre visit review using our clinic review tool, if applicable. No additional management support is needed unless otherwise documented below in the visit note. 

## 2016-06-26 NOTE — Patient Instructions (Addendum)
It was a pleasure to see you today. Your symptoms today are most likely caused by viral illness. Please drink plenty of water enough for your urine to be pale yellow or clear. You may use Tylenol 325 mg every 6 hours as needed. Benzonatate can be used for cough. Follow-up for evaluation if your symptoms do not improve in 3-4 days, worsen, or you develop a fever greater than 100.  Please take prednisone with food.   Upper Respiratory Infection, Adult Most upper respiratory infections (URIs) are caused by a virus. A URI affects the nose, throat, and upper air passages. The most common type of URI is often called "the common cold." Follow these instructions at home:  Take medicines only as told by your doctor.  Gargle warm saltwater or take cough drops to comfort your throat as told by your doctor.  Use a warm mist humidifier or inhale steam from a shower to increase air moisture. This may make it easier to breathe.  Drink enough fluid to keep your pee (urine) clear or pale yellow.  Eat soups and other clear broths.  Have a healthy diet.  Rest as needed.  Go back to work when your fever is gone or your doctor says it is okay.  You may need to stay home longer to avoid giving your URI to others.  You can also wear a face mask and wash your hands often to prevent spread of the virus.  Use your inhaler more if you have asthma.  Do not use any tobacco products, including cigarettes, chewing tobacco, or electronic cigarettes. If you need help quitting, ask your doctor. Contact a doctor if:  You are getting worse, not better.  Your symptoms are not helped by medicine.  You have chills.  You are getting more short of breath.  You have brown or red mucus.  You have yellow or brown discharge from your nose.  You have pain in your face, especially when you bend forward.  You have a fever.  You have puffy (swollen) neck glands.  You have pain while swallowing.  You have white  areas in the back of your throat. Get help right away if:  You have very bad or constant:  Headache.  Ear pain.  Pain in your forehead, behind your eyes, and over your cheekbones (sinus pain).  Chest pain.  You have long-lasting (chronic) lung disease and any of the following:  Wheezing.  Long-lasting cough.  Coughing up blood.  A change in your usual mucus.  You have a stiff neck.  You have changes in your:  Vision.  Hearing.  Thinking.  Mood. This information is not intended to replace advice given to you by your health care provider. Make sure you discuss any questions you have with your health care provider. Document Released: 08/20/2007 Document Revised: 11/04/2015 Document Reviewed: 06/08/2013 Elsevier Interactive Patient Education  2017 [place] NOW OFFER   [facility] FAST TRACK!!!  SAME DAY Appointments for ACUTE CARE  Such as: Sprains, Injuries, cuts, abrasions, rashes, muscle pain, joint pain, back pain Colds, flu, sore throats, headache, allergies, cough, fever  Ear pain, sinus and eye infections Abdominal pain, nausea, vomiting, diarrhea, upset stomach Animal/insect bites  3 Easy Ways to Schedule: Walk-In Scheduling Call in scheduling Mychart Sign-up: [email or web address]

## 2016-06-26 NOTE — Progress Notes (Signed)
Patient ID: KELE WITHEM, female   DOB: January 13, 1956, 61 y.o.    MRN: [id number]  PCP: Mauricio Po, FNP  Subjective:  Heidi Stevens is a 61 y.o. yearear old very pleasant female patient who presents with Upper Respiratory infection symptoms including nasal congestion, sore throat, cough productive with yellow sputum, rhinitis with clear drainage -started: 5 days , symptoms are not improving -previous treatments: Robitussin and mucinex has provided limited benefit -sick contacts/travel/risks: denies flu exposure. No recent sick contact exposure No influenza vaccine this year -Hx of: allergies No recent antibiotic use  ROS-denies fever, SOB, NVD, tooth pain  Pertinent Past Medical History-   Medications- reviewed  Current Outpatient Prescriptions  Medication Sig Dispense Refill  . esomeprazole (NEXIUM) 40 MG capsule Take 40 mg by mouth daily at 12 noon. Reported on 04/18/2015     No current facility-administered medications for this visit.     Objective: BP 120/80 (BP Location: Left Arm, Patient Position: Sitting, Cuff Size: Normal)   Pulse 60   Temp 98.2 F (36.8 C) (Oral)   Wt 208 lb 12.8 oz (94.7 kg)   SpO2 96%   BMI 38.19 kg/m  Gen: NAD, resting comfortably HEENT: Turbinates erythematous, TM normal, pharynx mildly erythematous with no tonsilar exudate or edema, +sinus tenderness; hoarseness noted CV: RRR no murmurs rubs or gallops Lungs: CTAB no crackles, wheeze, rhonchi Abdomen: soft/nontender/nondistended/normal bowel sounds. No rebound or guarding.  Ext: no edema Skin: warm, dry, no rash Neuro: grossly normal, moves all extremities  Assessment/Plan:  1. Acute upper respiratory infection History and exam today are suggestive of viral infection most likely due to upper respiratory infection. Symptomatic treatment with: Tylenol, benzonatate, and increase in fluids, rest. Short term use of prednisone for associated hoarseness.  We discussed that we did not find any  infection that had higher probability of being bacterial such as pneumonia or strep throat. We discussed signs that bacterial infection may have developed particularly fever or shortness of breath. Likely course of 1-2 weeks. Patient is contagious and advised good handwashing and consideration of mask If going to be in public places.   2. Cough  - predniSONE (DELTASONE) 10 MG tablet; Take 4 tablets once daily for 2 days, 3 tabs daily for 2 days, 2 tabs daily for 2 days, 1 tab daily for 2 days.  Dispense: 20 tablet; Refill: 0 - benzonatate (TESSALON) 100 MG capsule; Take 1 capsule (100 mg total) by mouth 3 (three) times daily.  Dispense: 20 capsule; Refill: 0  3. Allergic rhinitis, unspecified chronicity, unspecified seasonality, unspecified trigger  - fluticasone (FLONASE) 50 MCG/ACT nasal spray; Place 2 sprays into both nostrils daily.  Dispense: 16 g; Refill: 6   Finally, we reviewed reasons to return to care including if symptoms worsen or persist or new concerns arise- once again particularly shortness of breath or fever.   Laurita Quint, FNP

## 2016-10-06 ENCOUNTER — Ambulatory Visit (INDEPENDENT_AMBULATORY_CARE_PROVIDER_SITE_OTHER): Payer: BLUE CROSS/BLUE SHIELD | Admitting: Adult Health

## 2016-10-06 ENCOUNTER — Encounter: Payer: Self-pay | Admitting: Adult Health

## 2016-10-06 VITALS — BP 128/82 | Temp 98.2°F | Wt 206.0 lb

## 2016-10-06 DIAGNOSIS — M5432 Sciatica, left side: Secondary | ICD-10-CM

## 2016-10-06 MED ORDER — METHYLPREDNISOLONE 4 MG PO TBPK: ORAL_TABLET | ORAL | 0 refills | Status: DC

## 2016-10-06 MED ORDER — CYCLOBENZAPRINE HCL 10 MG PO TABS: 10.0000 mg | ORAL_TABLET | Freq: Three times a day (TID) | ORAL | 0 refills | Status: DC | PRN

## 2016-10-06 NOTE — Progress Notes (Signed)
Subjective:    Patient ID: Heidi Stevens, female    DOB: 10-02-1955, 61 y.o.   MRN: [id number]  HPI  61 year old female who  has a past medical history of Anemia; Fibroids; H/O menorrhagia; and H/O sinusitis. She is a patient of Terri Piedra, NP, who I am seeing today for the first time for an acute issue of sciatica. She reports that for the last 2-3 days she has been experiencing a burning pain from her left lower back/buttocks that travels down the outside of her left leg. She also reports that when she sits for an extended period of time her left leg will fell numb.   She denies any trauma or falls.   She has been using Motrin with no resolution in pain   Review of Systems See HPI   Past Medical History:  Diagnosis Date  . Anemia   . Fibroids   . H/O menorrhagia   . H/O sinusitis     Social History   Social History  . Marital status: Married    Spouse name: N/A  . Number of children: 2  . Years of education: 12   Occupational History  .  [employer]   Social History Main Topics  . Smoking status: Former Research scientist (life sciences)  . Smokeless tobacco: Never Used  . Alcohol use No  . Drug use: No  . Sexual activity: Yes    Partners: Male    Birth control/ protection: Surgical   Other Topics Concern  . Not on file   Social History Narrative   HSG. Married. 2 children: 1 dtr - '78, 1 son '91. Works at Advertising copywriter. Denies history of abuse   Fun: Grandbabies           Past Surgical History:  Procedure Laterality Date  . arthrsocopy knee     right knee in '04  . BREAST SURGERY  1985   reduction mammoplsty  . G2P2    . TOTAL ABDOMINAL HYSTERECTOMY W/ BILATERAL SALPINGOOPHORECTOMY  07/06/07  . WISDOM TOOTH EXTRACTION  1980    Family History  Problem Relation Age of Onset  . Diabetes Mother   . Hypertension Mother   . Stroke Mother   . Hypertension Father   . Diabetes Father   . Stroke Father   . Liver cancer Father   . Diabetes Paternal Grandmother   . Asthma Son     . Breast cancer Sister     No Known Allergies  Current Outpatient Prescriptions on File Prior to Visit  Medication Sig Dispense Refill  . esomeprazole (NEXIUM) 40 MG capsule Take 40 mg by mouth daily at 12 noon. Reported on 04/18/2015    . fluticasone (FLONASE) 50 MCG/ACT nasal spray Place 2 sprays into both nostrils daily. (Patient not taking: Reported on 10/06/2016) 16 g 6   No current facility-administered medications on file prior to visit.     BP 128/82 (BP Location: Left Arm)   Temp 98.2 F (36.8 C) (Oral)   Wt 206 lb (93.4 kg)   BMI 37.68 kg/m       Objective:   Physical Exam  Constitutional: She is oriented to person, place, and time. She appears well-developed and well-nourished. No distress.  Cardiovascular: Normal rate, regular rhythm, normal heart sounds and intact distal pulses.  Exam reveals no gallop and no friction rub.   No murmur heard. Pulmonary/Chest: Effort normal and breath sounds normal. No respiratory distress. She has no wheezes. She has no  rales. She exhibits no tenderness.  Musculoskeletal: She exhibits tenderness (along leftsided sciatic nerve ).  Neurological: She is alert and oriented to person, place, and time.  Skin: Skin is warm and dry. No rash noted. She is not diaphoretic. No erythema. No pallor.  Psychiatric: She has a normal mood and affect. Her behavior is normal. Judgment and thought content normal.  Nursing note and vitals reviewed.     Assessment & Plan:  1. Sciatica of left side - Exam consistent with sciatica pain. Will rpescribe prednisone and flexeril. She can continue to use Motrin. May also want to use a heating pad  - methylPREDNISolone (MEDROL DOSEPAK) 4 MG TBPK tablet; Take as directed  Dispense: 21 tablet; Refill: 0 - cyclobenzaprine (FLEXERIL) 10 MG tablet; Take 1 tablet (10 mg total) by mouth 3 (three) times daily as needed for muscle spasms.  Dispense: 30 tablet; Refill: 0   Dorothyann Peng, NP

## 2016-10-29 ENCOUNTER — Other Ambulatory Visit: Payer: Self-pay | Admitting: Family

## 2016-10-29 DIAGNOSIS — Z1231 Encounter for screening mammogram for malignant neoplasm of breast: Secondary | ICD-10-CM

## 2016-10-30 ENCOUNTER — Ambulatory Visit
Admission: RE | Admit: 2016-10-30 | Discharge: 2016-10-30 | Disposition: A | Discharge status: Home / Self Care | Payer: BLUE CROSS/BLUE SHIELD | Source: Ambulatory Visit | Attending: Family | Admitting: Family

## 2016-10-30 DIAGNOSIS — Z1231 Encounter for screening mammogram for malignant neoplasm of breast: Secondary | ICD-10-CM

## 2016-12-15 DIAGNOSIS — L089 Local infection of the skin and subcutaneous tissue, unspecified: Secondary | ICD-10-CM | POA: Diagnosis not present

## 2016-12-15 DIAGNOSIS — L669 Cicatricial alopecia, unspecified: Secondary | ICD-10-CM | POA: Diagnosis not present

## 2017-02-02 ENCOUNTER — Encounter: Payer: Self-pay | Admitting: Nurse Practitioner

## 2017-02-02 ENCOUNTER — Ambulatory Visit (INDEPENDENT_AMBULATORY_CARE_PROVIDER_SITE_OTHER): Payer: BLUE CROSS/BLUE SHIELD | Admitting: Nurse Practitioner

## 2017-02-02 VITALS — BP 124/84 | HR 62 | Temp 98.1°F | Resp 16 | Ht 62.0 in | Wt 203.0 lb

## 2017-02-02 DIAGNOSIS — R05 Cough: Secondary | ICD-10-CM | POA: Diagnosis not present

## 2017-02-02 DIAGNOSIS — B349 Viral infection, unspecified: Secondary | ICD-10-CM

## 2017-02-02 DIAGNOSIS — K2100 Gastro-esophageal reflux disease with esophagitis, without bleeding: Secondary | ICD-10-CM

## 2017-02-02 DIAGNOSIS — R490 Dysphonia: Secondary | ICD-10-CM | POA: Diagnosis not present

## 2017-02-02 DIAGNOSIS — R059 Cough, unspecified: Secondary | ICD-10-CM

## 2017-02-02 LAB — POCT INFLUENZA A/B
INFLUENZA A, POC: NEGATIVE
Influenza B, POC: NEGATIVE

## 2017-02-02 MED ORDER — ESOMEPRAZOLE MAGNESIUM 40 MG PO CPDR: 40.0000 mg | DELAYED_RELEASE_CAPSULE | Freq: Every day | ORAL | 2 refills | Status: DC

## 2017-02-02 MED ORDER — HYDROCODONE-HOMATROPINE 5-1.5 MG/5ML PO SYRP: 5.0000 mL | ORAL_SOLUTION | Freq: Three times a day (TID) | ORAL | 0 refills | Status: DC | PRN

## 2017-02-02 MED ORDER — ESOMEPRAZOLE MAGNESIUM 20 MG PO CPDR: 20.0000 mg | DELAYED_RELEASE_CAPSULE | Freq: Every day | ORAL | 1 refills | Status: DC

## 2017-02-02 NOTE — Progress Notes (Signed)
Subjective:    Patient ID: Heidi Stevens, female    DOB: 1956/01/09, 61 y.o.   MRN: [id number]  HPI Heidi Stevens is a 61 yo who presents today to establish care. She is transferring to me from another provider in the same clinic.  Hoarseness-Her chief complaint today is hoarseness. Her symptoms began on this past Wednesday. She initially had a sore throat and body aches. By Thursday she developed a cough and had several episodes of nausea and vomiting. Over the weekend she began to feel better but her cough has persisted and now she is hoarse. Her cough has been keeping her awake at night. She has noticed some blood tinged sputum at times. She denies fevers, malaise, headaches, chest pain, shortness of breath, abdominal pina. She's been using robitussin and lemon tea at home with some symptom relief.  GERD- maintained on nexium daily. She does experience some heartburn and acid reflux when she does not take nexium. Denies any adverse medication reactions.   Review of Systems  See HPI  Past Medical History:  Diagnosis Date  . Anemia   . Fibroids   . H/O menorrhagia   . H/O sinusitis      Social History   Socioeconomic History  . Marital status: Married    Spouse name: Not on file  . Number of children: 2  . Years of education: 56  . Highest education level: Not on file  Social Needs  . Financial resource strain: Not on file  . Food insecurity - worry: Not on file  . Food insecurity - inability: Not on file  . Transportation needs - medical: Not on file  . Transportation needs - non-medical: Not on file  Occupational History    Employer: [employer]  Tobacco Use  . Smoking status: Former Research scientist (life sciences)  . Smokeless tobacco: Never Used  Substance and Sexual Activity  . Alcohol use: No  . Drug use: No  . Sexual activity: Yes    Partners: Male    Birth control/protection: Surgical  Other Topics Concern  . Not on file  Social History Narrative   HSG. Married. 2 children: 1 dtr -  '78, 1 son '91. Works at Advertising copywriter. Denies history of abuse   Fun: Grandbabies        Past Surgical History:  Procedure Laterality Date  . arthrsocopy knee     right knee in '04  . BREAST EXCISIONAL BIOPSY Right   . BREAST SURGERY  1985   reduction mammoplsty  . G2P2    . REDUCTION MAMMAPLASTY Bilateral 1985  . TOTAL ABDOMINAL HYSTERECTOMY W/ BILATERAL SALPINGOOPHORECTOMY  07/06/07  . WISDOM TOOTH EXTRACTION  1980    Family History  Problem Relation Age of Onset  . Diabetes Mother   . Hypertension Mother   . Stroke Mother   . Hypertension Father   . Diabetes Father   . Stroke Father   . Liver cancer Father   . Diabetes Paternal Grandmother   . Asthma Son   . Breast cancer Sister     No Known Allergies  Current Outpatient Medications on File Prior to Visit  Medication Sig Dispense Refill  . cyclobenzaprine (FLEXERIL) 10 MG tablet Take 1 tablet (10 mg total) by mouth 3 (three) times daily as needed for muscle spasms. 30 tablet 0  . esomeprazole (NEXIUM) 40 MG capsule Take 40 mg by mouth daily at 12 noon. Reported on 04/18/2015    . finasteride (PROSCAR) 5 MG tablet Take  5 mg daily by mouth. Take 1/2 tablet by mouth daily    . fluticasone (FLONASE) 50 MCG/ACT nasal spray Place 2 sprays into both nostrils daily. 16 g 6   No current facility-administered medications on file prior to visit.     BP 124/84 (BP Location: Left Arm, Patient Position: Sitting, Cuff Size: Large)   Pulse 62   Temp 98.1 F (36.7 C) (Oral)   Resp 16   Ht 5\' 2"  (1.575 m)   Wt 203 lb (92.1 kg)   SpO2 99%   BMI 37.13 kg/m       Objective:   Physical Exam  Constitutional: She is oriented to person, place, and time. She appears well-developed and well-nourished. No distress.  HENT:  Head: Normocephalic and atraumatic.  Right Ear: Hearing, tympanic membrane, external ear and ear canal normal.  Left Ear: Hearing, tympanic membrane, external ear and ear canal normal.  Nose: Nose normal.    Mouth/Throat: Uvula is midline, oropharynx is clear and moist and mucous membranes are normal.  Cardiovascular: Normal rate, regular rhythm and intact distal pulses.  Pulmonary/Chest: Effort normal and breath sounds normal. No respiratory distress. She has no wheezes.  Neurological: She is alert and oriented to person, place, and time. Coordination normal.  Skin: Skin is warm and dry.  Psychiatric: She has a normal mood and affect. Judgment and thought content normal.      Assessment & Plan:   Viral infection -flu test negative today. History and PE consistent with viral infection. She is mostly improving except for cough, hoarseness. Discussed humidified air, hydration, voice rest at home to improve hoarseness. Hycodan given for cough-dicussed side effects including drowsiness. She will follow up for worsening of symptoms, high fevers, no improvement by Wednesday.

## 2017-02-02 NOTE — Patient Instructions (Addendum)
Your flu test today was negative.  Your symptoms today reflect a virus. Please stay hydrated and rest your voice as much as possible. It may help to humidify your air as well.  I have sent a prescription for hycodan to your pharmacy for your cough.  You can take 5 mls every 8 hours as needed for your cough. This medication can make you drowsy. Do not mix it with alcohol.  Please follow up for fevers over 101 or if you are feeling no better by Wednesday.  Laryngitis Laryngitis is inflammation of your vocal cords. This causes hoarseness, coughing, loss of voice, sore throat, or a dry throat. Your vocal cords are two bands of muscles that are found in your throat. When you speak, these cords come together and vibrate. These vibrations come out through your mouth as sound. When your vocal cords are inflamed, your voice sounds different. Laryngitis can be temporary (acute) or long-term (chronic). Most cases of acute laryngitis improve with time. Chronic laryngitis is laryngitis that lasts for more than three weeks. What are the causes? Acute laryngitis may be caused by:  A viral infection.  Lots of talking, yelling, or singing. This is also called vocal strain.  Bacterial infections.  Chronic laryngitis may be caused by:  Vocal strain.  Injury to your vocal cords.  Acid reflux (gastroesophageal reflux disease or GERD).  Allergies.  Sinus infection.  Smoking.  Alcohol abuse.  Breathing in chemicals or dust.  Growths on the vocal cords.  What increases the risk? Risk factors for laryngitis include:  Smoking.  Alcohol abuse.  Having allergies.  What are the signs or symptoms? Symptoms of laryngitis may include:  Low, hoarse voice.  Loss of voice.  Dry cough.  Sore throat.  Stuffy nose.  How is this diagnosed? Laryngitis may be diagnosed by:  Physical exam.  Throat culture.  Blood test.  Laryngoscopy. This procedure allows your health care provider to  look at your vocal cords with a mirror or viewing tube.  How is this treated? Treatment for laryngitis depends on what is causing it. Usually, treatment involves resting your voice and using medicines to soothe your throat. However, if your laryngitis is caused by a bacterial infection, you may need to take antibiotic medicine. If your laryngitis is caused by a growth, you may need to have a procedure to remove it. Follow these instructions at home:  Drink enough fluid to keep your urine clear or pale yellow.  Breathe in moist air. Use a humidifier if you live in a dry climate.  Take medicines only as directed by your health care provider.  If you were prescribed an antibiotic medicine, finish it all even if you start to feel better.  Do not smoke cigarettes or electronic cigarettes. If you need help quitting, ask your health care provider.  Talk as little as possible. Also avoid whispering, which can cause vocal strain.  Write instead of talking. Do this until your voice is back to normal. Contact a health care provider if:  You have a fever.  You have increasing pain.  You have difficulty swallowing. Get help right away if:  You cough up blood.  You have trouble breathing. This information is not intended to replace advice given to you by your health care provider. Make sure you discuss any questions you have with your health care provider. Document Released: 03/03/2005 Document Revised: 08/09/2015 Document Reviewed: 08/16/2013 Elsevier Interactive Patient Education  [employer].

## 2017-02-02 NOTE — Assessment & Plan Note (Signed)
Maintained on nexium 20mg  daily.  She will have symptoms if she misses a dose Continue medication at current dosage, refill sent.

## 2017-04-10 ENCOUNTER — Telehealth: Payer: Self-pay

## 2017-04-10 NOTE — Telephone Encounter (Signed)
Received fax from [facility] on [address] stating that nexium is no longer covered under pts insurance and an alternative needs to be sent in. Please advise.

## 2017-04-12 ENCOUNTER — Other Ambulatory Visit: Payer: Self-pay | Admitting: Nurse Practitioner

## 2017-04-12 DIAGNOSIS — K2100 Gastro-esophageal reflux disease with esophagitis, without bleeding: Secondary | ICD-10-CM

## 2017-04-12 MED ORDER — PANTOPRAZOLE SODIUM 20 MG PO TBEC: 20.0000 mg | DELAYED_RELEASE_TABLET | Freq: Every day | ORAL | 1 refills | Status: DC

## 2017-04-12 NOTE — Telephone Encounter (Signed)
I have sent a new prescription for protonix 20mg  daily- please have her follow up in about 1 month - I want to see how her GERD symptoms are doing on the new medication/dosage.

## 2017-04-13 NOTE — Telephone Encounter (Signed)
LVM for pt to call back in regards.  

## 2017-04-17 NOTE — Telephone Encounter (Signed)
Pt is aware of response from [facility] below. I have set up a 1 month follow for GERD med.

## 2017-04-17 NOTE — Telephone Encounter (Signed)
LVM for pt to call back.

## 2017-05-18 ENCOUNTER — Ambulatory Visit: Payer: BLUE CROSS/BLUE SHIELD | Admitting: Nurse Practitioner

## 2017-06-12 ENCOUNTER — Ambulatory Visit (INDEPENDENT_AMBULATORY_CARE_PROVIDER_SITE_OTHER): Payer: BLUE CROSS/BLUE SHIELD | Admitting: Nurse Practitioner

## 2017-06-12 ENCOUNTER — Encounter: Payer: Self-pay | Admitting: Nurse Practitioner

## 2017-06-12 DIAGNOSIS — K2100 Gastro-esophageal reflux disease with esophagitis, without bleeding: Secondary | ICD-10-CM

## 2017-06-12 MED ORDER — PANTOPRAZOLE SODIUM 40 MG PO TBEC: 40.0000 mg | DELAYED_RELEASE_TABLET | Freq: Every day | ORAL | 1 refills | Status: DC

## 2017-06-12 NOTE — Progress Notes (Addendum)
Name: Heidi Stevens   MRN: [id number]    DOB: Oct 02, 1955   Date:06/12/2017       Progress Note  Subjective  Chief Complaint  Chief Complaint  Patient presents with  . Follow-up    still having problems with acid reflux, states she is having to take 2 protonix    HPI  GERD- We switched her PPI from nexium to protonix in January due to insurance coverage She was initially started on protonix 20 daily but has actually increased dosage to 40 mg daily on her own due to uncontrolled symptoms of GERD She does admit that she does not take the medicine daily but rather as needed once she gets symptoms of GERD- heartburn, acid regurgitation She felt like her symptoms were better controlled on nexium but can not afford to go back to the nexium She notices her GERD is worse after eating certain foods like spicy foods or ketchup She denies nausea, vomiting, abdominal pain, rectal bleeding, numbness or tingling.  Patient Active Problem List   Diagnosis Date Noted  . Syncopal episodes 06/19/2015  . Lightheadedness 06/19/2015  . Routine general medical examination at a health care facility 09/21/2014  . Primary localized osteoarthrosis, lower leg 07/11/2013  . IT band syndrome 07/11/2013  . Obesity, Class II, BMI 35-39.9 05/31/2013  . Acute meniscal tear of knee, subsequent encounter 04/27/2013  . Anemia   . Fibroids   . H/O sinusitis   . GERD (gastroesophageal reflux disease) 10/24/2010  . Healthcare maintenance 10/24/2010  . ALLERGIC RHINITIS CAUSE UNSPECIFIED 07/03/2009    Past Surgical History:  Procedure Laterality Date  . arthrsocopy knee     right knee in '04  . BREAST EXCISIONAL BIOPSY Right   . BREAST SURGERY  1985   reduction mammoplsty  . G2P2    . REDUCTION MAMMAPLASTY Bilateral 1985  . TOTAL ABDOMINAL HYSTERECTOMY W/ BILATERAL SALPINGOOPHORECTOMY  07/06/07  . WISDOM TOOTH EXTRACTION  1980    Family History  Problem Relation Age of Onset  . Diabetes Mother   .  Hypertension Mother   . Stroke Mother   . Hypertension Father   . Diabetes Father   . Stroke Father   . Liver cancer Father   . Diabetes Paternal Grandmother   . Asthma Son   . Breast cancer Sister     Social History   Socioeconomic History  . Marital status: Married    Spouse name: Not on file  . Number of children: 2  . Years of education: 58  . Highest education level: Not on file  Occupational History    Employer: [employer]  Social Needs  . Financial resource strain: Not on file  . Food insecurity:    Worry: Not on file    Inability: Not on file  . Transportation needs:    Medical: Not on file    Non-medical: Not on file  Tobacco Use  . Smoking status: Former Research scientist (life sciences)  . Smokeless tobacco: Never Used  Substance and Sexual Activity  . Alcohol use: No  . Drug use: No  . Sexual activity: Yes    Partners: Male    Birth control/protection: Surgical  Lifestyle  . Physical activity:    Days per week: Not on file    Minutes per session: Not on file  . Stress: Not on file  Relationships  . Social connections:    Talks on phone: Not on file    Gets together: Not on file  Attends religious service: Not on file    Active member of club or organization: Not on file    Attends meetings of clubs or organizations: Not on file    Relationship status: Not on file  . Intimate partner violence:    Fear of current or ex partner: Not on file    Emotionally abused: Not on file    Physically abused: Not on file    Forced sexual activity: Not on file  Other Topics Concern  . Not on file  Social History Narrative   HSG. Married. 2 children: 1 dtr - '78, 1 son '91. Works at Advertising copywriter. Denies history of abuse   Fun: Grandbabies         Current Outpatient Medications:  .  cyclobenzaprine (FLEXERIL) 10 MG tablet, Take 1 tablet (10 mg total) by mouth 3 (three) times daily as needed for muscle spasms., Disp: 30 tablet, Rfl: 0 .  finasteride (PROSCAR) 5 MG tablet, Take 5 mg  daily by mouth. Take 1/2 tablet by mouth daily, Disp: , Rfl:  .  fluticasone (FLONASE) 50 MCG/ACT nasal spray, Place 2 sprays into both nostrils daily., Disp: 16 g, Rfl: 6 .  pantoprazole (PROTONIX) 20 MG tablet, Take 1 tablet (20 mg total) by mouth daily., Disp: 30 tablet, Rfl: 1  No Known Allergies   ROS See HPI  Objective  Vitals:   06/12/17 1342  BP: 124/76  Pulse: 67  Resp: 16  Temp: 98 F (36.7 C)  TempSrc: Oral  SpO2: 96%  Weight: 216 lb (98 kg)  Height: 5\' 2"  (1.575 m)    Body mass index is 39.51 kg/m.  Physical Exam Vital signs reviewed. Constitutional: Patient appears well-developed and well-nourished. No distress.  HENT: Head: Normocephalic and atraumatic. Nose: Nose normal. Mouth/Throat: Oropharynx is clear and moist. No oropharyngeal exudate.  Eyes: Conjunctivae and EOM are normal. Pupils are equal, round, and reactive to light. No scleral icterus.  Neck: Normal range of motion. Neck supple.  Cardiovascular: Normal rate, regular rhythm and normal heart sounds. Distal pulses intact. Pulmonary/Chest: Effort normal and breath sounds normal. No respiratory distress. Abdominal: Soft. Bowel sounds are normal, no distension. Skin: Skin is warm and dry. Psychiatric: Patient has a normal mood and affect. behavior is normal. Judgment and thought content normal.  Assessment & Plan RTC in 1 month for F/U: GERD- resuming protonix; CPE if well  1. Gastroesophageal reflux disease with esophagitis Discussed long term risks of uncontrolled GERD including ulcers and esophageal cancer and importance of daily medication adherence to prevent negative outcomes Also discussed GERD diet and lifestyle modifications and printed in AVS Instructed to resume protonix daily and RTC in 1 month for F/U Will refer to GI for further evaluation and management if she continues to have symptoms  - pantoprazole (PROTONIX) 40 MG tablet; Take 1 tablet (40 mg total) by mouth daily.  Dispense: 30  tablet; Refill: 1

## 2017-06-12 NOTE — Patient Instructions (Addendum)
I have sent prescription for protonix 40mg  once daily to your pharmacy. Please take this every day and come back in 1 month so I can see how you are doing We can do your annual physical if you feel well at your next visit.  It was good to see you. Thanks for letting me take care of you today :)   Food Choices for Gastroesophageal Reflux Disease, Adult When you have gastroesophageal reflux disease (GERD), the foods you eat and your eating habits are very important. Choosing the right foods can help ease your discomfort. What guidelines do I need to follow?  Choose fruits, vegetables, whole grains, and low-fat dairy products.  Choose low-fat meat, fish, and poultry.  Limit fats such as oils, salad dressings, butter, nuts, and avocado.  Keep a food diary. This helps you identify foods that cause symptoms.  Avoid foods that cause symptoms. These may be different for everyone.  Eat small meals often instead of 3 large meals a day.  Eat your meals slowly, in a place where you are relaxed.  Limit fried foods.  Cook foods using methods other than frying.  Avoid drinking alcohol.  Avoid drinking large amounts of liquids with your meals.  Avoid bending over or lying down until 2-3 hours after eating. What foods are not recommended? These are some foods and drinks that may make your symptoms worse: Vegetables Tomatoes. Tomato juice. Tomato and spaghetti sauce. Chili peppers. Onion and garlic. Horseradish. Fruits Oranges, grapefruit, and lemon (fruit and juice). Meats High-fat meats, fish, and poultry. This includes hot dogs, ribs, ham, sausage, salami, and bacon. Dairy Whole milk and chocolate milk. Sour cream. Cream. Butter. Ice cream. Cream cheese. Drinks Coffee and tea. Bubbly (carbonated) drinks or energy drinks. Condiments Hot sauce. Barbecue sauce. Sweets/Desserts Chocolate and cocoa. Donuts. Peppermint and spearmint. Fats and Oils High-fat foods. This includes [place]  fries and potato chips. Other Vinegar. Strong spices. This includes black pepper, white pepper, red pepper, cayenne, curry powder, cloves, ginger, and chili powder. The items listed above may not be a complete list of foods and drinks to avoid. Contact your dietitian for more information. This information is not intended to replace advice given to you by your health care provider. Make sure you discuss any questions you have with your health care provider. Document Released: 09/02/2011 Document Revised: 08/09/2015 Document Reviewed: 01/05/2013 Elsevier Interactive Patient Education  2017 [employer].

## 2017-06-15 ENCOUNTER — Other Ambulatory Visit: Payer: Self-pay | Admitting: Nurse Practitioner

## 2017-06-15 DIAGNOSIS — K2100 Gastro-esophageal reflux disease with esophagitis, without bleeding: Secondary | ICD-10-CM

## 2017-07-23 ENCOUNTER — Ambulatory Visit: Payer: BLUE CROSS/BLUE SHIELD | Admitting: Nurse Practitioner

## 2017-08-15 ENCOUNTER — Other Ambulatory Visit: Payer: Self-pay | Admitting: Nurse Practitioner

## 2017-08-15 DIAGNOSIS — K2100 Gastro-esophageal reflux disease with esophagitis, without bleeding: Secondary | ICD-10-CM

## 2017-09-03 IMAGING — DX DG SHOULDER 2+V*R*
2 series · 2 of 2 positions shown · non-contrast
Comparison: None.

CLINICAL DATA: 60 y/o F; anterior shoulder and humerus pain after
pulling injury.

EXAM:
RIGHT SHOULDER - 2+ VIEW

[shoulder ap]
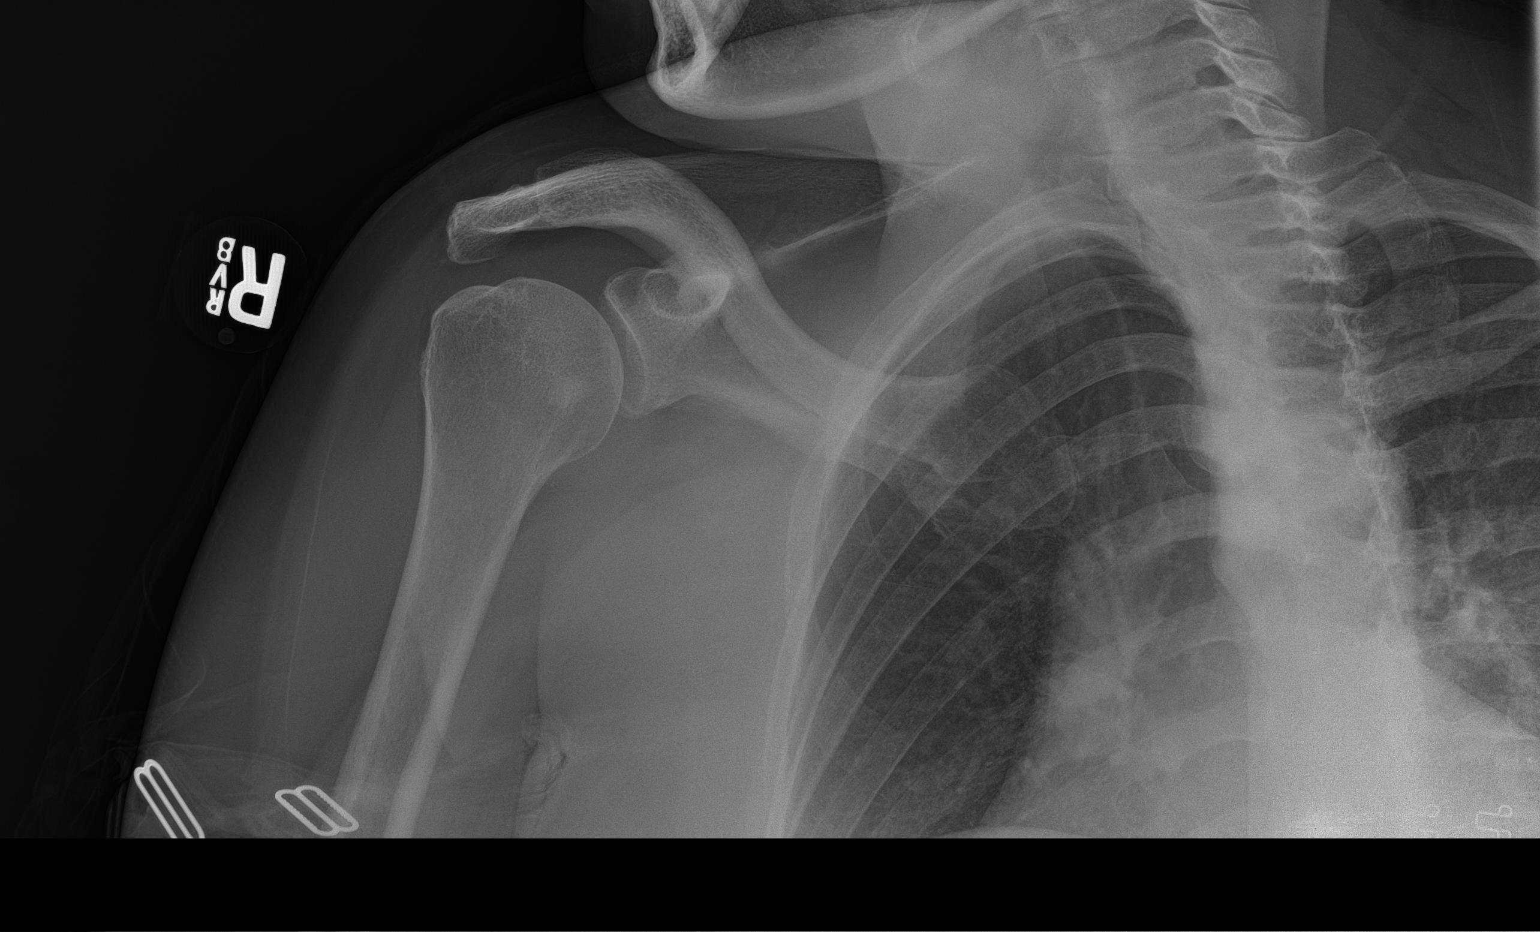

[shoulder obl]
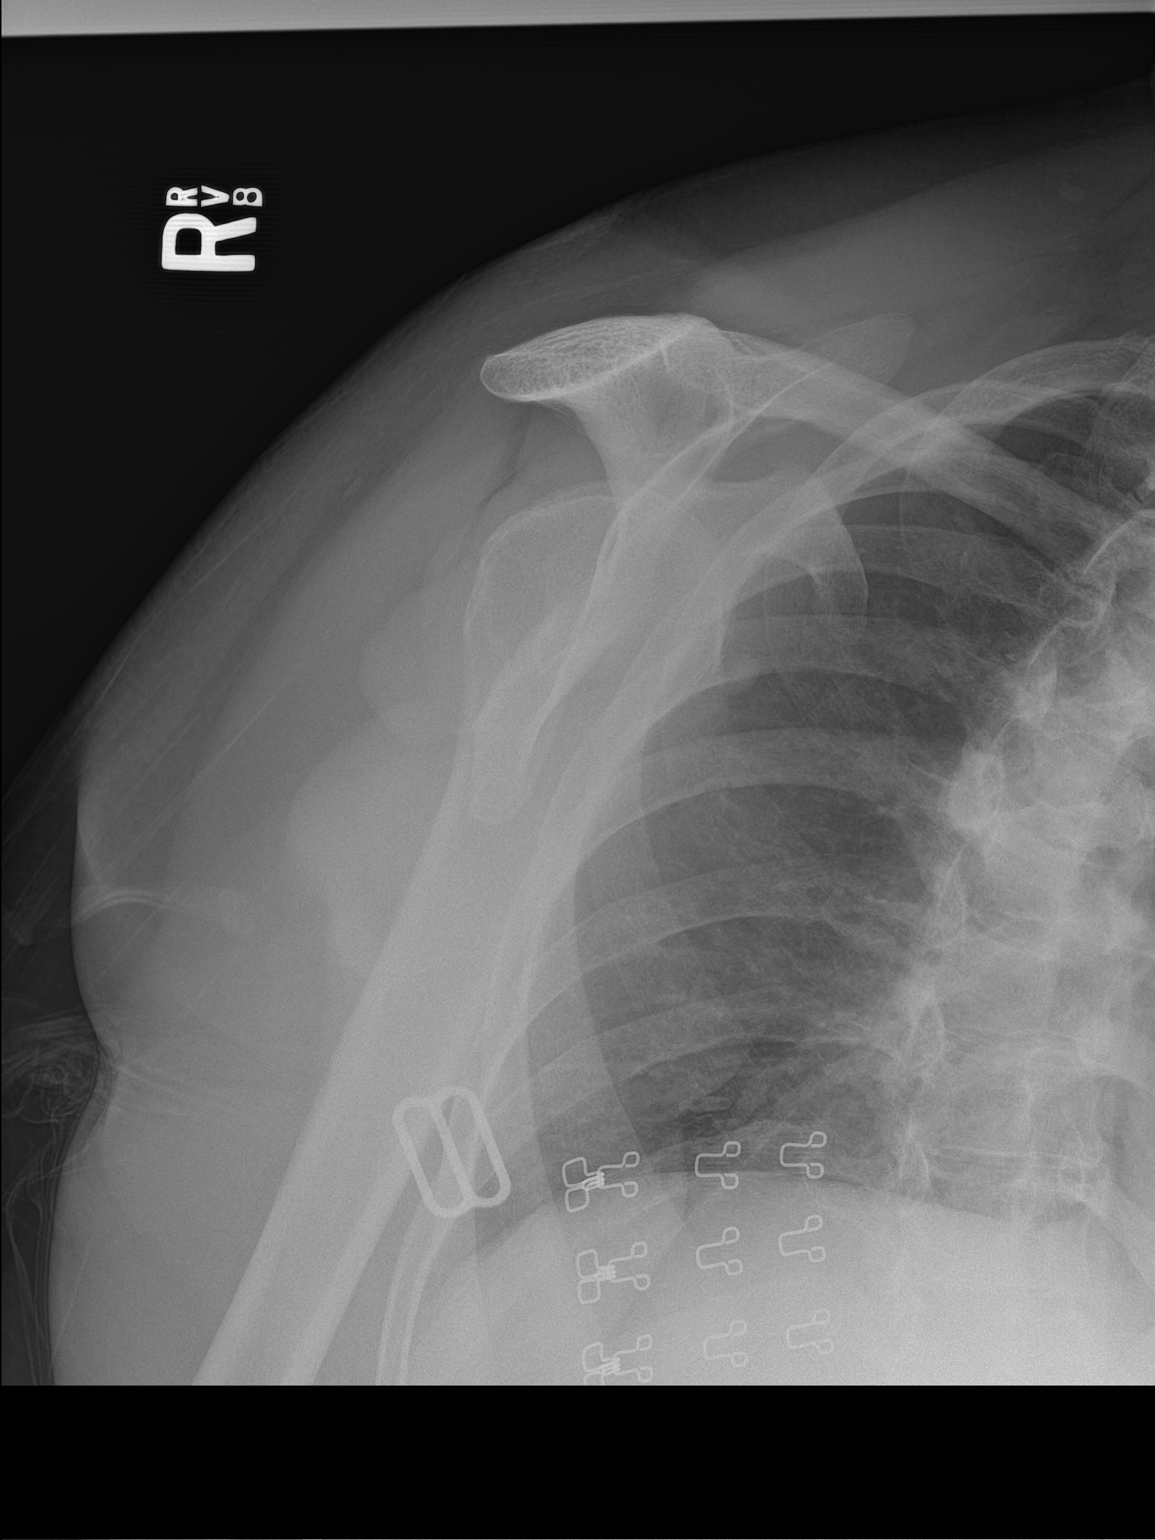

[2 of 2 positions shown; findings below may reference images not displayed]

FINDINGS: There is no evidence of fracture or dislocation. There is no
evidence of arthropathy or other focal bone abnormality. Soft
tissues are unremarkable.
IMPRESSION: Negative.

By: Nijah Mazur M.D.

## 2018-02-04 ENCOUNTER — Other Ambulatory Visit (INDEPENDENT_AMBULATORY_CARE_PROVIDER_SITE_OTHER): Payer: PRIVATE HEALTH INSURANCE

## 2018-02-04 ENCOUNTER — Encounter: Payer: Self-pay | Admitting: Nurse Practitioner

## 2018-02-04 ENCOUNTER — Ambulatory Visit (INDEPENDENT_AMBULATORY_CARE_PROVIDER_SITE_OTHER): Payer: PRIVATE HEALTH INSURANCE | Admitting: Nurse Practitioner

## 2018-02-04 VITALS — BP 120/80 | HR 63 | Ht 62.0 in | Wt 218.0 lb

## 2018-02-04 DIAGNOSIS — Z23 Encounter for immunization: Secondary | ICD-10-CM

## 2018-02-04 DIAGNOSIS — K2100 Gastro-esophageal reflux disease with esophagitis, without bleeding: Secondary | ICD-10-CM

## 2018-02-04 DIAGNOSIS — Z1159 Encounter for screening for other viral diseases: Secondary | ICD-10-CM

## 2018-02-04 DIAGNOSIS — Z13220 Encounter for screening for lipoid disorders: Secondary | ICD-10-CM

## 2018-02-04 DIAGNOSIS — Z0000 Encounter for general adult medical examination without abnormal findings: Secondary | ICD-10-CM

## 2018-02-04 DIAGNOSIS — Z9189 Other specified personal risk factors, not elsewhere classified: Secondary | ICD-10-CM

## 2018-02-04 LAB — CBC
HEMATOCRIT: 37.8 % (ref 36.0–46.0)
Hemoglobin: 12.5 g/dL (ref 12.0–15.0)
MCHC: 33.2 g/dL (ref 30.0–36.0)
MCV: 83.7 fl (ref 78.0–100.0)
Platelets: 322 10*3/uL (ref 150.0–400.0)
RBC: 4.52 Mil/uL (ref 3.87–5.11)
RDW: 15 % (ref 11.5–15.5)
WBC: 6.8 10*3/uL (ref 4.0–10.5)

## 2018-02-04 LAB — LIPID PANEL
CHOLESTEROL: 154 mg/dL (ref 0–200)
HDL: 51.2 mg/dL (ref >39.00)
LDL Cholesterol: 92 mg/dL (ref 0–99)
NonHDL: 103.18
Total CHOL/HDL Ratio: 3
Triglycerides: 58 mg/dL (ref 0.0–149.0)
VLDL: 11.6 mg/dL (ref 0.0–40.0)

## 2018-02-04 LAB — COMPREHENSIVE METABOLIC PANEL
ALBUMIN: 4.1 g/dL (ref 3.5–5.2)
ALT: 12 U/L (ref 0–35)
AST: 16 U/L (ref 0–37)
Alkaline Phosphatase: 68 U/L (ref 39–117)
BILIRUBIN TOTAL: 0.5 mg/dL (ref 0.2–1.2)
BUN: 11 mg/dL (ref 6–23)
CO2: 29 mEq/L (ref 19–32)
Calcium: 9.1 mg/dL (ref 8.4–10.5)
Chloride: 103 mEq/L (ref 96–112)
Creatinine, Ser: 0.91 mg/dL (ref 0.40–1.20)
GFR: 81.03 mL/min (ref >60.00)
Glucose, Bld: 86 mg/dL (ref 70–99)
POTASSIUM: 3.6 meq/L (ref 3.5–5.1)
Sodium: 140 mEq/L (ref 135–145)
TOTAL PROTEIN: 7.8 g/dL (ref 6.0–8.3)

## 2018-02-04 LAB — MAGNESIUM: MAGNESIUM: 2.2 mg/dL (ref 1.5–2.5)

## 2018-02-04 NOTE — Assessment & Plan Note (Signed)
Stable Continue protonix Update labs - Magnesium; Future

## 2018-02-04 NOTE — Assessment & Plan Note (Signed)
Reviewed annual screening exams, healthy lifestyle, weight loss, additional information provided on AVS She will call to schedule mammogram - CBC; Future - Comprehensive metabolic panel; Future - Lipid panel; Future - Magnesium; Future - Hepatitis C antibody; Future  Screening for cholesterol level She is fasting - Lipid panel; Future  Encounter for hepatitis C virus screening test for high risk patient - Hepatitis C antibody; Future

## 2018-02-04 NOTE — Patient Instructions (Addendum)
Head downstairs for lab work   Health Maintenance, Female Adopting a healthy lifestyle and getting preventive care can go a long way to promote health and wellness. Talk with your health care provider about what schedule of regular examinations is right for you. This is a good chance for you to check in with your provider about disease prevention and staying healthy. In between checkups, there are plenty of things you can do on your own. Experts have done a lot of research about which lifestyle changes and preventive measures are most likely to keep you healthy. Ask your health care provider for more information. Weight and diet Eat a healthy diet  Be sure to include plenty of vegetables, fruits, low-fat dairy products, and lean protein.  Do not eat a lot of foods high in solid fats, added sugars, or salt.  Get regular exercise. This is one of the most important things you can do for your health. ? Most adults should exercise for at least 150 minutes each week. The exercise should increase your heart rate and make you sweat (moderate-intensity exercise). ? Most adults should also do strengthening exercises at least twice a week. This is in addition to the moderate-intensity exercise.  Maintain a healthy weight  Body mass index (BMI) is a measurement that can be used to identify possible weight problems. It estimates body fat based on height and weight. Your health care provider can help determine your BMI and help you achieve or maintain a healthy weight.  For females 73 years of age and older: ? A BMI below 18.5 is considered underweight. ? A BMI of 18.5 to 24.9 is normal. ? A BMI of 25 to 29.9 is considered overweight. ? A BMI of 30 and above is considered obese.  Watch levels of cholesterol and blood lipids  You should start having your blood tested for lipids and cholesterol at 60 years of age, then have this test every 5 years.  You may need to have your cholesterol levels checked  more often if: ? Your lipid or cholesterol levels are high. ? You are older than 60 years of age. ? You are at high risk for heart disease.  Cancer screening Lung Cancer  Lung cancer screening is recommended for adults 20-74 years old who are at high risk for lung cancer because of a history of smoking.  A yearly low-dose CT scan of the lungs is recommended for people who: ? Currently smoke. ? Have quit within the past 15 years. ? Have at least a 30-pack-year history of smoking. A pack year is smoking an average of one pack of cigarettes a day for 1 year.  Yearly screening should continue until it has been 15 years since you quit.  Yearly screening should stop if you develop a health problem that would prevent you from having lung cancer treatment.  Breast Cancer  Practice breast self-awareness. This means understanding how your breasts normally appear and feel.  It also means doing regular breast self-exams. Let your health care provider know about any changes, no matter how small.  If you are in your 20s or 30s, you should have a clinical breast exam (CBE) by a health care provider every 1-3 years as part of a regular health exam.  If you are 79 or older, have a CBE every year. Also consider having a breast X-ray (mammogram) every year.  If you have a family history of breast cancer, talk to your health care provider about genetic screening.  If you are at high risk for breast cancer, talk to your health care provider about having an MRI and a mammogram every year.  Breast cancer gene (BRCA) assessment is recommended for women who have family members with BRCA-related cancers. BRCA-related cancers include: ? Breast. ? Ovarian. ? Tubal. ? Peritoneal cancers.  Results of the assessment will determine the need for genetic counseling and BRCA1 and BRCA2 testing.  Cervical Cancer Your health care provider may recommend that you be screened regularly for cancer of the pelvic  organs (ovaries, uterus, and vagina). This screening involves a pelvic examination, including checking for microscopic changes to the surface of your cervix (Pap test). You may be encouraged to have this screening done every 3 years, beginning at age 17.  For women ages 64-65, health care providers may recommend pelvic exams and Pap testing every 3 years, or they may recommend the Pap and pelvic exam, combined with testing for human papilloma virus (HPV), every 5 years. Some types of HPV increase your risk of cervical cancer. Testing for HPV may also be done on women of any age with unclear Pap test results.  Other health care providers may not recommend any screening for nonpregnant women who are considered low risk for pelvic cancer and who do not have symptoms. Ask your health care provider if a screening pelvic exam is right for you.  If you have had past treatment for cervical cancer or a condition that could lead to cancer, you need Pap tests and screening for cancer for at least 20 years after your treatment. If Pap tests have been discontinued, your risk factors (such as having a new sexual partner) need to be reassessed to determine if screening should resume. Some women have medical problems that increase the chance of getting cervical cancer. In these cases, your health care provider may recommend more frequent screening and Pap tests.  Colorectal Cancer  This type of cancer can be detected and often prevented.  Routine colorectal cancer screening usually begins at 60 years of age and continues through 60 years of age.  Your health care provider may recommend screening at an earlier age if you have risk factors for colon cancer.  Your health care provider may also recommend using home test kits to check for hidden blood in the stool.  A small camera at the end of a tube can be used to examine your colon directly (sigmoidoscopy or colonoscopy). This is done to check for the earliest forms  of colorectal cancer.  Routine screening usually begins at age 78.  Direct examination of the colon should be repeated every 5-10 years through 60 years of age. However, you may need to be screened more often if early forms of precancerous polyps or small growths are found.  Skin Cancer  Check your skin from head to toe regularly.  Tell your health care provider about any new moles or changes in moles, especially if there is a change in a mole's shape or color.  Also tell your health care provider if you have a mole that is larger than the size of a pencil eraser.  Always use sunscreen. Apply sunscreen liberally and repeatedly throughout the day.  Protect yourself by wearing long sleeves, pants, a wide-brimmed hat, and sunglasses whenever you are outside.  Heart disease, diabetes, and high blood pressure  High blood pressure causes heart disease and increases the risk of stroke. High blood pressure is more likely to develop in: ? People who have blood  pressure in the high end of the normal range (130-139/85-89 mm Hg). ? People who are overweight or obese. ? People who are African American.  If you are 65-57 years of age, have your blood pressure checked every 3-5 years. If you are 45 years of age or older, have your blood pressure checked every year. You should have your blood pressure measured twice-once when you are at a hospital or clinic, and once when you are not at a hospital or clinic. Record the average of the two measurements. To check your blood pressure when you are not at a hospital or clinic, you can use: ? An automated blood pressure machine at a pharmacy. ? A home blood pressure monitor.  If you are between 10 years and 29 years old, ask your health care provider if you should take aspirin to prevent strokes.  Have regular diabetes screenings. This involves taking a blood sample to check your fasting blood sugar level. ? If you are at a normal weight and have a low risk  for diabetes, have this test once every three years after 60 years of age. ? If you are overweight and have a high risk for diabetes, consider being tested at a younger age or more often. Preventing infection Hepatitis B  If you have a higher risk for hepatitis B, you should be screened for this virus. You are considered at high risk for hepatitis B if: ? You were born in a country where hepatitis B is common. Ask your health care provider which countries are considered high risk. ? Your parents were born in a high-risk country, and you have not been immunized against hepatitis B (hepatitis B vaccine). ? You have HIV or AIDS. ? You use needles to inject street drugs. ? You live with someone who has hepatitis B. ? You have had sex with someone who has hepatitis B. ? You get hemodialysis treatment. ? You take certain medicines for conditions, including cancer, organ transplantation, and autoimmune conditions.  Hepatitis C  Blood testing is recommended for: ? Everyone born from [birth year] through 1963. ? Anyone with known risk factors for hepatitis C.  Sexually transmitted infections (STIs)  You should be screened for sexually transmitted infections (STIs) including gonorrhea and chlamydia if: ? You are sexually active and are younger than 60 years of age. ? You are older than 60 years of age and your health care provider tells you that you are at risk for this type of infection. ? Your sexual activity has changed since you were last screened and you are at an increased risk for chlamydia or gonorrhea. Ask your health care provider if you are at risk.  If you do not have HIV, but are at risk, it may be recommended that you take a prescription medicine daily to prevent HIV infection. This is called pre-exposure prophylaxis (PrEP). You are considered at risk if: ? You are sexually active and do not regularly use condoms or know the HIV status of your partner(s). ? You take drugs by  injection. ? You are sexually active with a partner who has HIV.  Talk with your health care provider about whether you are at high risk of being infected with HIV. If you choose to begin PrEP, you should first be tested for HIV. You should then be tested every 3 months for as long as you are taking PrEP. Pregnancy  If you are premenopausal and you may become pregnant, ask your health care provider about preconception  counseling.  If you may become pregnant, take 400 to 800 micrograms (mcg) of folic acid every day.  If you want to prevent pregnancy, talk to your health care provider about birth control (contraception). Osteoporosis and menopause  Osteoporosis is a disease in which the bones lose minerals and strength with aging. This can result in serious bone fractures. Your risk for osteoporosis can be identified using a bone density scan.  If you are 54 years of age or older, or if you are at risk for osteoporosis and fractures, ask your health care provider if you should be screened.  Ask your health care provider whether you should take a calcium or vitamin D supplement to lower your risk for osteoporosis.  Menopause may have certain physical symptoms and risks.  Hormone replacement therapy may reduce some of these symptoms and risks. Talk to your health care provider about whether hormone replacement therapy is right for you. Follow these instructions at home:  Schedule regular health, dental, and eye exams.  Stay current with your immunizations.  Do not use any tobacco products including cigarettes, chewing tobacco, or electronic cigarettes.  If you are pregnant, do not drink alcohol.  If you are breastfeeding, limit how much and how often you drink alcohol.  Limit alcohol intake to no more than 1 drink per day for nonpregnant women. One drink equals 12 ounces of beer, 5 ounces of wine, or 1 ounces of hard liquor.  Do not use street drugs.  Do not share needles.  Ask  your health care provider for help if you need support or information about quitting drugs.  Tell your health care provider if you often feel depressed.  Tell your health care provider if you have ever been abused or do not feel safe at home. This information is not intended to replace advice given to you by your health care provider. Make sure you discuss any questions you have with your health care provider. Document Released: 09/16/2010 Document Revised: 08/09/2015 Document Reviewed: 12/05/2014 Elsevier Interactive Patient Education  [employer].

## 2018-02-04 NOTE — Progress Notes (Signed)
Heidi Stevens is a 62 y.o. female with the following history as recorded in [employer]:  Patient Active Problem List   Diagnosis Date Noted  . Syncopal episodes 06/19/2015  . Lightheadedness 06/19/2015  . Routine general medical examination at a health care facility 09/21/2014  . Primary localized osteoarthrosis, lower leg 07/11/2013  . IT band syndrome 07/11/2013  . Obesity, Class II, BMI 35-39.9 05/31/2013  . Acute meniscal tear of knee, subsequent encounter 04/27/2013  . Anemia   . Fibroids   . H/O sinusitis   . GERD (gastroesophageal reflux disease) 10/24/2010  . Healthcare maintenance 10/24/2010  . ALLERGIC RHINITIS CAUSE UNSPECIFIED 07/03/2009    Current Outpatient Medications  Medication Sig Dispense Refill  . finasteride (PROSCAR) 5 MG tablet Take 5 mg daily by mouth. Take 1/2 tablet by mouth daily    . pantoprazole (PROTONIX) 40 MG tablet TAKE 1 TABLET BY MOUTH EVERY DAY 30 tablet 1  . cyclobenzaprine (FLEXERIL) 10 MG tablet Take 1 tablet (10 mg total) by mouth 3 (three) times daily as needed for muscle spasms. (Patient not taking: Reported on 02/04/2018) 30 tablet 0  . fluticasone (FLONASE) 50 MCG/ACT nasal spray Place 2 sprays into both nostrils daily. (Patient not taking: Reported on 02/04/2018) 16 g 6   No current facility-administered medications for this visit.     Allergies: Patient has no known allergies.  Past Medical History:  Diagnosis Date  . Anemia   . Fibroids   . H/O menorrhagia   . H/O sinusitis     Past Surgical History:  Procedure Laterality Date  . arthrsocopy knee     right knee in '04  . BREAST EXCISIONAL BIOPSY Right   . BREAST SURGERY  1985   reduction mammoplsty  . G2P2    . REDUCTION MAMMAPLASTY Bilateral 1985  . TOTAL ABDOMINAL HYSTERECTOMY W/ BILATERAL SALPINGOOPHORECTOMY  07/06/07  . WISDOM TOOTH EXTRACTION  1980    Family History  Problem Relation Age of Onset  . Diabetes Mother   . Hypertension Mother   . Stroke Mother   .  Hypertension Father   . Diabetes Father   . Stroke Father   . Liver cancer Father   . Diabetes Paternal Grandmother   . Asthma Son   . Breast cancer Sister     Social History   Tobacco Use  . Smoking status: Former Research scientist (life sciences)  . Smokeless tobacco: Never Used  Substance Use Topics  . Alcohol use: No     Subjective:  Heidi Stevens is here today for CPE.   Last dental exam: 2018, no dental insurance currently Last vision exam: 2019  PAP: s/p complete hysterectomy 2009 Lung ca screening; n/a, quit 30 years ago Colonoscopy: up to date Mammogram: overdue but has received a letter to schedule with the breast center Lipids: lipid panel ordered DM screening- not indicated Vaccinations: flu vacc today Diet and exercise: wants to lose weight - plans to try to be more active, is going to start walking and going to exercise classes  No LMP recorded. Patient has had a hysterectomy.  Gerd- maintained on protonix 57, taking daily as prescribed with good control of symptoms, does experience symptoms if she does misses a dose  Review of Systems  Constitutional: Negative for chills and fever.  HENT: Negative for hearing loss.   Eyes: Negative for blurred vision and double vision.  Respiratory: Negative for cough and shortness of breath.   Cardiovascular: Negative for chest pain and palpitations.  Gastrointestinal: Negative for  constipation, diarrhea and heartburn.  Genitourinary: Negative for dysuria and hematuria.  Musculoskeletal: Negative for falls.  Skin: Negative for rash.  Neurological: Negative for dizziness, speech change, loss of consciousness and weakness.  Endo/Heme/Allergies: Does not bruise/bleed easily.  Psychiatric/Behavioral: Negative for depression. The patient is not nervous/anxious.     Objective:  Vitals:   02/04/18 1329  BP: 120/80  Pulse: 63  SpO2: 98%  Weight: 218 lb (98.9 kg)  Height: 5\' 2"  (1.575 m)  Body mass index is 39.87 kg/m.   General: Well developed,  well nourished, in no acute distress  Skin : Warm and dry.  Head: Normocephalic and atraumatic  Eyes: Sclera and conjunctiva clear; pupils round and reactive to light; extraocular movements intact  Ears: External normal; canals clear; tympanic membranes normal  Oropharynx: Pink, supple. No suspicious lesions  Neck: Supple without thyromegaly, adenopathy  Lungs: Respirations unlabored; clear to auscultation bilaterally without wheeze, rales, rhonchi  CVS exam: normal rate and regular rhythm, S1 and S2 normal.  Abdomen: Soft; nontender; rotund; normoactive bowel sounds; no masses or hepatosplenomegaly  Musculoskeletal: No deformities; no active joint inflammation  Extremities: No edema, cyanosis Vessels: Symmetric bilaterally  Neurologic: Alert and oriented; speech intact; face symmetrical; moves all extremities well; CNII-XII intact without focal deficit  Psychiatric: Normal mood and affect.  Assessment:  1. Routine general medical examination at a health care facility   2. Gastroesophageal reflux disease with esophagitis   3. Screening for cholesterol level     Plan:   Return in about 1 year (around 02/05/2019) for CPE.  No orders of the defined types were placed in this encounter.   Requested Prescriptions    No prescriptions requested or ordered in this encounter

## 2018-02-05 LAB — HEPATITIS C ANTIBODY
Hepatitis C Ab: NONREACTIVE
SIGNAL TO CUT-OFF: 0.18 (ref <1.00)

## 2018-02-08 ENCOUNTER — Ambulatory Visit: Payer: PRIVATE HEALTH INSURANCE

## 2018-02-15 ENCOUNTER — Telehealth: Payer: Self-pay | Admitting: *Deleted

## 2018-02-15 NOTE — Telephone Encounter (Signed)
Reviewed lab results and physician's note with patient. Results not forwarded to [facility]-not charted in results note.

## 2018-02-16 ENCOUNTER — Encounter: Payer: Self-pay | Admitting: *Deleted

## 2018-02-16 ENCOUNTER — Ambulatory Visit (INDEPENDENT_AMBULATORY_CARE_PROVIDER_SITE_OTHER): Payer: PRIVATE HEALTH INSURANCE | Admitting: *Deleted

## 2018-02-16 DIAGNOSIS — Z111 Encounter for screening for respiratory tuberculosis: Secondary | ICD-10-CM

## 2018-02-16 DIAGNOSIS — Z23 Encounter for immunization: Secondary | ICD-10-CM

## 2018-02-18 LAB — TB SKIN TEST
Induration: 0 mm
TB Skin Test: NEGATIVE

## 2018-02-26 ENCOUNTER — Encounter: Payer: Self-pay | Admitting: *Deleted

## 2018-11-11 ENCOUNTER — Other Ambulatory Visit: Payer: Self-pay | Admitting: Family

## 2018-11-11 DIAGNOSIS — Z1231 Encounter for screening mammogram for malignant neoplasm of breast: Secondary | ICD-10-CM

## 2018-11-12 ENCOUNTER — Other Ambulatory Visit: Payer: Self-pay

## 2018-11-12 ENCOUNTER — Ambulatory Visit
Admission: RE | Admit: 2018-11-12 | Discharge: 2018-11-12 | Disposition: A | Discharge status: Home / Self Care | Payer: PRIVATE HEALTH INSURANCE | Source: Ambulatory Visit | Attending: Family | Admitting: Family

## 2018-11-12 DIAGNOSIS — Z1231 Encounter for screening mammogram for malignant neoplasm of breast: Secondary | ICD-10-CM

## 2018-12-28 ENCOUNTER — Ambulatory Visit: Payer: PRIVATE HEALTH INSURANCE

## 2019-05-03 ENCOUNTER — Encounter: Payer: PRIVATE HEALTH INSURANCE | Admitting: Family

## 2019-05-23 ENCOUNTER — Encounter: Payer: PRIVATE HEALTH INSURANCE | Admitting: Family

## 2019-05-30 ENCOUNTER — Other Ambulatory Visit: Payer: Self-pay

## 2019-05-30 ENCOUNTER — Ambulatory Visit: Payer: 59 | Admitting: Family

## 2019-05-30 ENCOUNTER — Encounter: Payer: Self-pay | Admitting: Family

## 2019-05-30 VITALS — BP 122/80 | HR 73 | Temp 98.1°F | Ht 62.0 in | Wt 217.8 lb

## 2019-05-30 DIAGNOSIS — E559 Vitamin D deficiency, unspecified: Secondary | ICD-10-CM

## 2019-05-30 DIAGNOSIS — K2100 Gastro-esophageal reflux disease with esophagitis, without bleeding: Secondary | ICD-10-CM

## 2019-05-30 DIAGNOSIS — Z1211 Encounter for screening for malignant neoplasm of colon: Secondary | ICD-10-CM

## 2019-05-30 DIAGNOSIS — Z0000 Encounter for general adult medical examination without abnormal findings: Secondary | ICD-10-CM | POA: Diagnosis not present

## 2019-05-30 DIAGNOSIS — Z13220 Encounter for screening for lipoid disorders: Secondary | ICD-10-CM | POA: Diagnosis not present

## 2019-05-30 LAB — LIPID PANEL
Cholesterol: 159 mg/dL (ref 0–200)
HDL: 47.6 mg/dL (ref >39.00)
LDL Cholesterol: 96 mg/dL (ref 0–99)
NonHDL: 111.09
Total CHOL/HDL Ratio: 3
Triglycerides: 73 mg/dL (ref 0.0–149.0)
VLDL: 14.6 mg/dL (ref 0.0–40.0)

## 2019-05-30 LAB — COMPREHENSIVE METABOLIC PANEL
ALT: 11 U/L (ref 0–35)
AST: 16 U/L (ref 0–37)
Albumin: 3.7 g/dL (ref 3.5–5.2)
Alkaline Phosphatase: 68 U/L (ref 39–117)
BUN: 15 mg/dL (ref 6–23)
CO2: 30 mEq/L (ref 19–32)
Calcium: 9.2 mg/dL (ref 8.4–10.5)
Chloride: 103 mEq/L (ref 96–112)
Creatinine, Ser: 0.87 mg/dL (ref 0.40–1.20)
GFR: 79.95 mL/min (ref >60.00)
Glucose, Bld: 90 mg/dL (ref 70–99)
Potassium: 4.2 mEq/L (ref 3.5–5.1)
Sodium: 138 mEq/L (ref 135–145)
Total Bilirubin: 0.3 mg/dL (ref 0.2–1.2)
Total Protein: 7.6 g/dL (ref 6.0–8.3)

## 2019-05-30 LAB — CBC WITH DIFFERENTIAL/PLATELET
Basophils Absolute: 0 10*3/uL (ref 0.0–0.1)
Basophils Relative: 0.7 % (ref 0.0–3.0)
Eosinophils Absolute: 0.1 10*3/uL (ref 0.0–0.7)
Eosinophils Relative: 2.4 % (ref 0.0–5.0)
HCT: 36.3 % (ref 36.0–46.0)
Hemoglobin: 12.1 g/dL (ref 12.0–15.0)
Lymphocytes Relative: 33.6 % (ref 12.0–46.0)
Lymphs Abs: 1.9 10*3/uL (ref 0.7–4.0)
MCHC: 33.2 g/dL (ref 30.0–36.0)
MCV: 84.6 fl (ref 78.0–100.0)
Monocytes Absolute: 0.5 10*3/uL (ref 0.1–1.0)
Monocytes Relative: 9.8 % (ref 3.0–12.0)
Neutro Abs: 2.9 10*3/uL (ref 1.4–7.7)
Neutrophils Relative %: 53.5 % (ref 43.0–77.0)
Platelets: 313 10*3/uL (ref 150.0–400.0)
RBC: 4.3 Mil/uL (ref 3.87–5.11)
RDW: 14.8 % (ref 11.5–15.5)
WBC: 5.5 10*3/uL (ref 4.0–10.5)

## 2019-05-30 LAB — VITAMIN D 25 HYDROXY (VIT D DEFICIENCY, FRACTURES): VITD: 27.2 ng/mL — ABNORMAL LOW (ref 30.00–100.00)

## 2019-05-30 LAB — TSH: TSH: 1.92 u[IU]/mL (ref 0.35–4.50)

## 2019-05-30 MED ORDER — PANTOPRAZOLE SODIUM 40 MG PO TBEC: 40.0000 mg | DELAYED_RELEASE_TABLET | Freq: Two times a day (BID) | ORAL | 3 refills | Status: DC

## 2019-05-30 MED ORDER — TRIAMCINOLONE ACETONIDE 0.1 % EX CREA: TOPICAL_CREAM | CUTANEOUS | 1 refills | Status: DC

## 2019-05-30 NOTE — Progress Notes (Signed)
Heidi Stevens is a 64 y.o. female with the following history as recorded in [employer]:  Patient Active Problem List   Diagnosis Date Noted  . Syncopal episodes 06/19/2015  . Lightheadedness 06/19/2015  . Routine general medical examination at a health care facility 09/21/2014  . Primary localized osteoarthrosis, lower leg 07/11/2013  . IT band syndrome 07/11/2013  . Obesity, Class II, BMI 35-39.9 05/31/2013  . Acute meniscal tear of knee, subsequent encounter 04/27/2013  . Anemia   . Fibroids   . H/O sinusitis   . GERD (gastroesophageal reflux disease) 10/24/2010  . Healthcare maintenance 10/24/2010  . ALLERGIC RHINITIS CAUSE UNSPECIFIED 07/03/2009    Current Outpatient Medications  Medication Sig Dispense Refill  . pantoprazole (PROTONIX) 40 MG tablet Take 1 tablet (40 mg total) by mouth 2 (two) times daily. 180 tablet 3  . triamcinolone cream (KENALOG) 0.1 % Apply to affected areas on the legs as needed once daily 30 g 1   No current facility-administered medications for this visit.    Allergies: Patient has no known allergies.  Past Medical History:  Diagnosis Date  . Anemia   . Fibroids   . H/O menorrhagia   . H/O sinusitis     Past Surgical History:  Procedure Laterality Date  . arthrsocopy knee     right knee in '04  . BREAST EXCISIONAL BIOPSY Right   . BREAST SURGERY  1985   reduction mammoplsty  . G2P2    . REDUCTION MAMMAPLASTY Bilateral 1985  . TOTAL ABDOMINAL HYSTERECTOMY W/ BILATERAL SALPINGOOPHORECTOMY  07/06/07  . WISDOM TOOTH EXTRACTION  1980    Family History  Problem Relation Age of Onset  . Diabetes Mother   . Hypertension Mother   . Stroke Mother   . Hypertension Father   . Diabetes Father   . Stroke Father   . Liver cancer Father   . Diabetes Paternal Grandmother   . Asthma Son   . Breast cancer Sister     Social History   Tobacco Use  . Smoking status: Former Research scientist (life sciences)  . Smokeless tobacco: Never Used  Substance Use Topics  .  Alcohol use: No    Subjective:  Patient presents for yearly CPE; in baseline state of health;  Up to date on dental and eye exam; Needs updated colonoscopy order;   Review of Systems  Constitutional: Negative.   HENT: Negative.   Eyes: Negative.   Respiratory: Negative.   Cardiovascular: Negative.   Gastrointestinal: Negative.   Genitourinary: Negative.   Musculoskeletal: Negative.   Skin: Negative.   Neurological: Negative.   Endo/Heme/Allergies: Negative.   Psychiatric/Behavioral: Negative.       Objective:  Vitals:   05/30/19 1352  BP: 122/80  Pulse: 73  Temp: 98.1 F (36.7 C)  TempSrc: Oral  SpO2: 98%  Weight: 217 lb 12.8 oz (98.8 kg)  Height: 5' 2" (1.575 m)    General: Well developed, well nourished, in no acute distress  Skin : Warm and dry.  Head: Normocephalic and atraumatic  Eyes: Sclera and conjunctiva clear; pupils round and reactive to light; extraocular movements intact  Ears: External normal; canals clear; tympanic membranes normal  Oropharynx: Pink, supple. No suspicious lesions  Neck: Supple without thyromegaly, adenopathy  Lungs: Respirations unlabored; clear to auscultation bilaterally without wheeze, rales, rhonchi  CVS exam: normal rate and regular rhythm.  Abdomen: Soft; nontender; nondistended; normoactive bowel sounds; no masses or hepatosplenomegaly  Musculoskeletal: No deformities; no active joint inflammation  Extremities: No edema, cyanosis,  clubbing  Vessels: Symmetric bilaterally  Neurologic: Alert and oriented; speech intact; face symmetrical; moves all extremities well; CNII-XII intact without focal deficit   Assessment:  1. PE (physical exam), annual   2. Encounter for screening colonoscopy   3. Gastroesophageal reflux disease with esophagitis   4. Lipid screening   5. Vitamin D deficiency     Plan:  Age appropriate preventive healthcare needs addressed; encouraged regular eye doctor and dental exams; encouraged regular  exercise and weight loss- needs to work on limiting intake of sodas; will update labs and refills as needed today; follow-up to be determined; Follow-up in 1 year, sooner prn.   This visit occurred during the SARS-CoV-2 public health emergency.  Safety protocols were in place, including screening questions prior to the visit, additional usage of staff PPE, and extensive cleaning of exam room while observing appropriate contact time as indicated for disinfecting solutions.     No follow-ups on file.  Orders Placed This Encounter  Procedures  . CBC with Differential/Platelet  . Comp Met (CMET)  . Lipid panel  . TSH  . Vitamin D (25 hydroxy)  . Ambulatory referral to Gastroenterology    Referral Priority:   Routine    Referral Type:   Consultation    Referral Reason:   Specialty Services Required    Referred to Provider:   Milus Banister, MD    Number of Visits Requested:   1    Requested Prescriptions   Signed Prescriptions Disp Refills  . pantoprazole (PROTONIX) 40 MG tablet 180 tablet 3    Sig: Take 1 tablet (40 mg total) by mouth 2 (two) times daily.  Marland Kitchen triamcinolone cream (KENALOG) 0.1 % 30 g 1    Sig: Apply to affected areas on the legs as needed once daily

## 2019-07-01 ENCOUNTER — Ambulatory Visit: Payer: 59 | Admitting: Family

## 2019-07-01 DIAGNOSIS — Z0289 Encounter for other administrative examinations: Secondary | ICD-10-CM

## 2019-08-23 ENCOUNTER — Telehealth: Payer: Self-pay | Admitting: Family

## 2019-08-23 NOTE — Telephone Encounter (Signed)
Reassurance given; her mother's shingles have been covered and the patient has not had direct contact with the lesions. Follow-up if she has further questions or concerns.

## 2019-08-23 NOTE — Telephone Encounter (Signed)
New message:   Pt is calling and states she was around her mother has shingles and she wants to know is it contagious for her or how should she go being around this pt. Please advise.

## 2019-11-11 ENCOUNTER — Other Ambulatory Visit: Payer: Self-pay | Admitting: Family

## 2019-11-11 ENCOUNTER — Other Ambulatory Visit: Payer: Self-pay

## 2019-11-11 ENCOUNTER — Ambulatory Visit: Payer: 59 | Admitting: Family

## 2019-11-11 ENCOUNTER — Ambulatory Visit (INDEPENDENT_AMBULATORY_CARE_PROVIDER_SITE_OTHER): Payer: 59

## 2019-11-11 VITALS — BP 134/78 | HR 71 | Temp 98.2°F | Ht 62.0 in | Wt 223.0 lb

## 2019-11-11 DIAGNOSIS — M25551 Pain in right hip: Secondary | ICD-10-CM

## 2019-11-11 DIAGNOSIS — Z1211 Encounter for screening for malignant neoplasm of colon: Secondary | ICD-10-CM | POA: Diagnosis not present

## 2019-11-11 MED ORDER — METHYLPREDNISOLONE ACETATE 40 MG/ML IJ SUSP
40.0000 mg | Freq: Once | INTRAMUSCULAR | Status: AC
  Administered 2019-11-11: 40 mg via INTRAMUSCULAR

## 2019-11-11 MED ORDER — MELOXICAM 15 MG PO TABS: 15.0000 mg | ORAL_TABLET | Freq: Every day | ORAL | 0 refills | Status: DC

## 2019-11-11 NOTE — Progress Notes (Signed)
Heidi Stevens is a 64 y.o. female with the following history as recorded in Epic[facility]:  Patient Active Problem List   Diagnosis Date Noted   Syncopal episodes 06/19/2015   Lightheadedness 06/19/2015   Routine general medical examination at a health care facility 09/21/2014   Primary localized osteoarthrosis, lower leg 07/11/2013   IT band syndrome 07/11/2013   Obesity, Class II, BMI 35-39.9 05/31/2013   Acute meniscal tear of knee, subsequent encounter 04/27/2013   Anemia    Fibroids    H/O sinusitis    GERD (gastroesophageal reflux disease) 10/24/2010   Healthcare maintenance 10/24/2010   ALLERGIC RHINITIS CAUSE UNSPECIFIED 07/03/2009    Current Outpatient Medications  Medication Sig Dispense Refill   pantoprazole (PROTONIX) 40 MG tablet Take 1 tablet (40 mg total) by mouth 2 (two) times daily. 180 tablet 3   triamcinolone cream (KENALOG) 0.1 % Apply to affected areas on the legs as needed once daily 30 g 1   meloxicam (MOBIC) 15 MG tablet Take 1 tablet (15 mg total) by mouth daily. 30 tablet 0   No current facility-administered medications for this visit.    Allergies: Patient has no known allergies.  Past Medical History:  Diagnosis Date   Anemia    Fibroids    H/O menorrhagia    H/O sinusitis     Past Surgical History:  Procedure Laterality Date   arthrsocopy knee     right knee in '04   BREAST EXCISIONAL BIOPSY Right    BREAST SURGERY  1985   reduction mammoplsty   G2P2     REDUCTION MAMMAPLASTY Bilateral 1985   TOTAL ABDOMINAL HYSTERECTOMY W/ BILATERAL SALPINGOOPHORECTOMY  07/06/07   WISDOM TOOTH EXTRACTION  1980    Family History  Problem Relation Age of Onset   Diabetes Mother    Hypertension Mother    Stroke Mother    Hypertension Father    Diabetes Father    Stroke Father    Liver cancer Father    Diabetes Paternal Grandmother    Asthma Son    Breast cancer Sister     Social History   Tobacco Use    Smoking status: Former Smoker   Smokeless tobacco: Never Used  Substance Use Topics   Alcohol use: No    Subjective:  Complaining of worsening right hip and back pain; had considered seeing sports med in the past but declined as symptoms initially improved; having to take increased amounts of Advil and Aleve recently;  Scheduled for colonoscopy next month;      Objective:  Vitals:   11/11/19 1400  BP: 134/78  Pulse: 71  Temp: 98.2 F (36.8 C)  TempSrc: Oral  SpO2: 98%  Weight: 223 lb (101.2 kg)  Height: 5\' 2"  (1.575 m)    General: Well developed, well nourished, in no acute distress  Skin : Warm and dry.  Head: Normocephalic and atraumatic  Lungs: Respirations unlabored;  Musculoskeletal: No deformities; no active joint inflammation  Extremities: No edema, cyanosis, clubbing  Vessels: Symmetric bilaterally  Neurologic: Alert and oriented; speech intact; face symmetrical; moves all extremities well; CNII-XII intact without focal deficit   Assessment:  1. Right hip pain   2. Encounter for screening colonoscopy     Plan:  ? If pain is actually coming from her hip; update X-ray of hip and lumbar spine today; Depo-Medrol IM 40 mg given in office; Rx for Mobic 15 mg daily to use as needed; to consider MRI and/or referral to sports medicine;  Colonoscopy is scheduled for next month;   This visit occurred during the SARS-CoV-2 public health emergency.  Safety protocols were in place, including screening questions prior to the visit, additional usage of staff PPE, and extensive cleaning of exam room while observing appropriate contact time as indicated for disinfecting solutions.    No follow-ups on file.  Orders Placed This Encounter  Procedures   DG Lumbar Spine Complete    Standing Status:   Future    Number of Occurrences:   1    Standing Expiration Date:   11/10/2020    Order Specific Question:   Reason for Exam (SYMPTOM  OR DIAGNOSIS REQUIRED)    Answer:   low back  pain    Order Specific Question:   Preferred imaging location?    Answer:   [facility]    Order Specific Question:   Radiology Contrast Protocol - do NOT remove file path    Answer:   [email or web address]    Requested Prescriptions   Signed Prescriptions Disp Refills   meloxicam (MOBIC) 15 MG tablet 30 tablet 0    Sig: Take 1 tablet (15 mg total) by mouth daily.

## 2019-11-15 ENCOUNTER — Ambulatory Visit (AMBULATORY_SURGERY_CENTER): Payer: Self-pay | Admitting: *Deleted

## 2019-11-15 ENCOUNTER — Other Ambulatory Visit: Payer: Self-pay

## 2019-11-15 VITALS — Ht 62.0 in | Wt 218.0 lb

## 2019-11-15 DIAGNOSIS — Z1211 Encounter for screening for malignant neoplasm of colon: Secondary | ICD-10-CM

## 2019-11-15 MED ORDER — SUPREP BOWEL PREP KIT 17.5-3.13-1.6 GM/177ML PO SOLN: 1.0000 | Freq: Once | ORAL | 0 refills | Status: AC

## 2019-11-15 NOTE — Progress Notes (Signed)
06-11-19 comp cov vax   No egg or soy allergy known to patient  No issues with past sedation with any surgeries or procedures no intubation problems in the past  No FH of Malignant Hyperthermia No diet pills per patient No home 02 use per patient  No blood thinners per patient  Pt denies issues with constipation  No A fib or A flutter  EMMI video to pt or via [place] 19 guidelines implemented in PV today with Pt and RN    Due to the COVID-19 pandemic we are asking patients to follow these guidelines. Please only bring one care partner. Please be aware that your care partner may wait in the car in the parking lot or if they feel like they will be too hot to wait in the car, they may wait in the lobby on the 4th floor. All care partners are required to wear a mask the entire time (we do not have any that we can provide them), they need to practice social distancing, and we will do a Covid check for all patient's and care partners when you arrive. Also we will check their temperature and your temperature. If the care partner waits in their car they need to stay in the parking lot the entire time and we will call them on their cell phone when the patient is ready for discharge so they can bring the car to the front of the building. Also all patient's will need to wear a mask into building.

## 2019-11-22 ENCOUNTER — Other Ambulatory Visit: Payer: Self-pay

## 2019-11-22 ENCOUNTER — Encounter: Payer: Self-pay | Admitting: Gastroenterology

## 2019-11-22 ENCOUNTER — Ambulatory Visit (AMBULATORY_SURGERY_CENTER): Payer: 59 | Admitting: Gastroenterology

## 2019-11-22 VITALS — BP 132/91 | HR 64 | Temp 97.1°F | Resp 13 | Ht 62.0 in | Wt 218.0 lb

## 2019-11-22 DIAGNOSIS — Z1211 Encounter for screening for malignant neoplasm of colon: Secondary | ICD-10-CM

## 2019-11-22 DIAGNOSIS — D122 Benign neoplasm of ascending colon: Secondary | ICD-10-CM | POA: Diagnosis not present

## 2019-11-22 MED ORDER — SODIUM CHLORIDE 0.9 % IV SOLN: 500.0000 mL | Freq: Once | INTRAVENOUS | Status: DC

## 2019-11-22 NOTE — Progress Notes (Signed)
Pt's states no medical or surgical changes since previsit or office visit. 

## 2019-11-22 NOTE — Op Note (Signed)
[place] Patient Name: Heidi Stevens Procedure Date: 11/22/2019 7:54 AM MRN: [id number] Endoscopist: Milus Banister , MD Age: 64 Referring MD:  Date of Birth: 10-21-1955 Gender: Female Account #: [id number] Procedure:                Colonoscopy Indications:              Screening for colorectal malignant neoplasm Medicines:                Monitored Anesthesia Care Procedure:                Pre-Anesthesia Assessment:                           - Prior to the procedure, a History and Physical                            was performed, and patient medications and                            allergies were reviewed. The patient's tolerance of                            previous anesthesia was also reviewed. The risks                            and benefits of the procedure and the sedation                            options and risks were discussed with the patient.                            All questions were answered, and informed consent                            was obtained. Prior Anticoagulants: The patient has                            taken no previous anticoagulant or antiplatelet                            agents. ASA Grade Assessment: II - A patient with                            mild systemic disease. After reviewing the risks                            and benefits, the patient was deemed in                            satisfactory condition to undergo the procedure.                           After obtaining informed consent, the colonoscope  was passed under direct vision. Throughout the                            procedure, the patient's blood pressure, pulse, and                            oxygen saturations were monitored continuously. The                            Colonoscope was introduced through the anus and                            advanced to the the cecum, identified by                            appendiceal orifice and  ileocecal valve. The                            colonoscopy was performed without difficulty. The                            patient tolerated the procedure well. The quality                            of the bowel preparation was good. The ileocecal                            valve, appendiceal orifice, and rectum were                            photographed. Scope In: 8:03:05 AM Scope Out: 8:11:54 AM Scope Withdrawal Time: 0 hours 6 minutes 46 seconds  Total Procedure Duration: 0 hours 8 minutes 49 seconds  Findings:                 A 2 mm polyp was found in the ascending colon. The                            polyp was sessile. The polyp was removed with a                            cold snare. Resection and retrieval were complete.                           Multiple medium-mouthed diverticula were found in                            the entire colon.                           The exam was otherwise without abnormality on                            direct and retroflexion views. Complications:  No immediate complications. Estimated blood loss:                            None. Estimated Blood Loss:     Estimated blood loss: none. Impression:               - One 2 mm polyp in the ascending colon, removed                            with a cold snare. Resected and retrieved.                           - Diverticulosis in the entire examined colon.                           - The examination was otherwise normal on direct                            and retroflexion views. Recommendation:           - Patient has a contact number available for                            emergencies. The signs and symptoms of potential                            delayed complications were discussed with the                            patient. Return to normal activities tomorrow.                            Written discharge instructions were provided to the                            patient.                            - Resume previous diet.                           - Continue present medications.                           - Await pathology results. Milus Banister, MD 11/22/2019 8:13:37 AM This report has been signed electronically.

## 2019-11-22 NOTE — Progress Notes (Signed)
pt tolerated well. VSS. awake and to recovery. Report given to RN.  

## 2019-11-22 NOTE — Progress Notes (Signed)
Lidocaine 2% 75ml as per Dr. Ardis Hughs.

## 2019-11-22 NOTE — Patient Instructions (Signed)
Please read handouts provided. Resume present medications. Await pathology results.      YOU HAD AN ENDOSCOPIC PROCEDURE TODAY AT [facility]:   Refer to the procedure report that was given to you for any specific questions about what was found during the examination.  If the procedure report does not answer your questions, please call your gastroenterologist to clarify.  If you requested that your care partner not be given the details of your procedure findings, then the procedure report has been included in a sealed envelope for you to review at your convenience later.  YOU SHOULD EXPECT: Some feelings of bloating in the abdomen. Passage of more gas than usual.  Walking can help get rid of the air that was put into your GI tract during the procedure and reduce the bloating. If you had a lower endoscopy (such as a colonoscopy or flexible sigmoidoscopy) you may notice spotting of blood in your stool or on the toilet paper. If you underwent a bowel prep for your procedure, you may not have a normal bowel movement for a few days.  Please Note:  You might notice some irritation and congestion in your nose or some drainage.  This is from the oxygen used during your procedure.  There is no need for concern and it should clear up in a day or so.  SYMPTOMS TO REPORT IMMEDIATELY:   Following lower endoscopy (colonoscopy or flexible sigmoidoscopy):  Excessive amounts of blood in the stool  Significant tenderness or worsening of abdominal pains  Swelling of the abdomen that is new, acute  Fever of 100F or higher   For urgent or emergent issues, a gastroenterologist can be reached at any hour by calling [phone]. Do not use MyChart messaging for urgent concerns.    DIET:  We do recommend a small meal at first, but then you may proceed to your regular diet.  Drink plenty of fluids but you should avoid alcoholic beverages for 24 hours.  ACTIVITY:  You should plan to take it  easy for the rest of today and you should NOT DRIVE or use heavy machinery until tomorrow (because of the sedation medicines used during the test).    FOLLOW UP: Our staff will call the number listed on your records 48-72 hours following your procedure to check on you and address any questions or concerns that you may have regarding the information given to you following your procedure. If we do not reach you, we will leave a message.  We will attempt to reach you two times.  During this call, we will ask if you have developed any symptoms of COVID 19. If you develop any symptoms (ie: fever, flu-like symptoms, shortness of breath, cough etc.) before then, please call [phone].  If you test positive for Covid 19 in the 2 weeks post procedure, please call and report this information to [place].    If any biopsies were taken you will be contacted by phone or by letter within the next 1-3 weeks.  Please call us at [phone] if you have not heard about the biopsies in 3 weeks.    SIGNATURES/CONFIDENTIALITY: You and/or your care partner have signed paperwork which will be entered into your electronic medical record.  These signatures attest to the fact that that the information above on your After Visit Summary has been reviewed and is understood.  Full responsibility of the confidentiality of this discharge information lies with you and/or your care-partner.

## 2019-11-22 NOTE — Progress Notes (Signed)
VS taken by C.W. 

## 2019-11-22 NOTE — Progress Notes (Signed)
Called to room to assist during endoscopic procedure.  Patient ID and intended procedure confirmed with present staff. Received instructions for my participation in the procedure from the performing physician.  

## 2019-11-24 ENCOUNTER — Telehealth: Payer: Self-pay | Admitting: *Deleted

## 2019-11-24 NOTE — Telephone Encounter (Signed)
  Follow up Call-  Call back number 11/22/2019  Post procedure Call Back phone  # [phone]  Permission to leave phone message Yes  Some recent data might be hidden     Patient questions:  Do you have a fever, pain , or abdominal swelling? No. Pain Score  0 *  Have you tolerated food without any problems? Yes.    Have you been able to return to your normal activities? Yes.    Do you have any questions about your discharge instructions: Diet   No. Medications  No. Follow up visit  No.  Do you have questions or concerns about your Care? No.  Actions: * If pain score is 4 or above: No action needed, pain <4.  1. Have you developed a fever since your procedure? no  2.   Have you had an respiratory symptoms (SOB or cough) since your procedure? no  3.   Have you tested positive for COVID 19 since your procedure no  4.   Have you had any family members/close contacts diagnosed with the COVID 19 since your procedure?  no   If yes to any of these questions please route to Joylene John, RN and Joella Prince, RN

## 2019-11-28 ENCOUNTER — Encounter: Payer: Self-pay | Admitting: Gastroenterology

## 2019-12-03 ENCOUNTER — Other Ambulatory Visit: Payer: Self-pay | Admitting: Family

## 2019-12-05 ENCOUNTER — Other Ambulatory Visit: Payer: Self-pay | Admitting: Family

## 2019-12-05 DIAGNOSIS — Z1231 Encounter for screening mammogram for malignant neoplasm of breast: Secondary | ICD-10-CM

## 2019-12-07 ENCOUNTER — Ambulatory Visit
Admission: RE | Admit: 2019-12-07 | Discharge: 2019-12-07 | Disposition: A | Discharge status: Home / Self Care | Payer: 59 | Source: Ambulatory Visit | Attending: Family | Admitting: Family

## 2019-12-07 ENCOUNTER — Other Ambulatory Visit: Payer: Self-pay

## 2019-12-07 DIAGNOSIS — Z1231 Encounter for screening mammogram for malignant neoplasm of breast: Secondary | ICD-10-CM

## 2019-12-30 ENCOUNTER — Other Ambulatory Visit: Payer: Self-pay | Admitting: Family

## 2019-12-30 MED ORDER — MELOXICAM 15 MG PO TABS: 15.0000 mg | ORAL_TABLET | Freq: Every day | ORAL | 0 refills | Status: DC

## 2019-12-30 NOTE — Addendum Note (Signed)
Addended by: Earnstine Regal on: 12/30/2019 03:46 PM   Modules accepted: Orders

## 2019-12-30 NOTE — Telephone Encounter (Signed)
Resent to pof../lmb 

## 2020-02-02 ENCOUNTER — Other Ambulatory Visit: Payer: Self-pay | Admitting: Family

## 2020-02-23 ENCOUNTER — Ambulatory Visit: Payer: 59 | Admitting: Podiatry

## 2020-02-27 ENCOUNTER — Other Ambulatory Visit: Payer: Self-pay | Admitting: Family

## 2020-02-28 ENCOUNTER — Ambulatory Visit (INDEPENDENT_AMBULATORY_CARE_PROVIDER_SITE_OTHER): Payer: 59

## 2020-02-28 ENCOUNTER — Other Ambulatory Visit: Payer: Self-pay

## 2020-02-28 ENCOUNTER — Ambulatory Visit (INDEPENDENT_AMBULATORY_CARE_PROVIDER_SITE_OTHER): Payer: 59 | Admitting: Podiatry

## 2020-02-28 ENCOUNTER — Encounter: Payer: Self-pay | Admitting: Podiatry

## 2020-02-28 DIAGNOSIS — M2011 Hallux valgus (acquired), right foot: Secondary | ICD-10-CM

## 2020-02-28 DIAGNOSIS — Q828 Other specified congenital malformations of skin: Secondary | ICD-10-CM

## 2020-02-28 DIAGNOSIS — M2580 Other specified joint disorders, unspecified joint: Secondary | ICD-10-CM

## 2020-02-28 DIAGNOSIS — M7751 Other enthesopathy of right foot: Secondary | ICD-10-CM | POA: Diagnosis not present

## 2020-02-28 DIAGNOSIS — M778 Other enthesopathies, not elsewhere classified: Secondary | ICD-10-CM | POA: Diagnosis not present

## 2020-02-28 MED ORDER — DEXAMETHASONE SODIUM PHOSPHATE 120 MG/30ML IJ SOLN
2.0000 mg | Freq: Once | INTRAMUSCULAR | Status: AC
  Administered 2020-02-28: 2 mg via INTRA_ARTICULAR

## 2020-02-28 NOTE — Progress Notes (Signed)
Subjective:  Patient ID: Heidi Stevens, female    DOB: 1956-02-12,  MRN: [id number] HPI Chief Complaint  Patient presents with  . Foot Pain    1st MPJ and 2nd toe right - bunion and hammertoe deformity x years, starting to hurt more recently, tried a toe brace, but made shoes too tight  . New Patient (Initial Visit)    64 y.o. female presents with the above complaint.   ROS: Denies fever chills nausea vomiting muscle aches pains calf pain back pain chest pain shortness of breath.  Past Medical History:  Diagnosis Date  . Anemia   . Arthritis    hip, right knee   . Fibroids   . GERD (gastroesophageal reflux disease)   . H/O menorrhagia   . H/O sinusitis   . Syncope    2017 due to dehydration    Past Surgical History:  Procedure Laterality Date  . arthrsocopy knee     right knee in '04  . BREAST EXCISIONAL BIOPSY Right   . BREAST SURGERY  1985   reduction mammoplsty  . COLONOSCOPY    . G2P2    . REDUCTION MAMMAPLASTY Bilateral 1985  . TOTAL ABDOMINAL HYSTERECTOMY W/ BILATERAL SALPINGOOPHORECTOMY  07/06/07  . WISDOM TOOTH EXTRACTION  1980    Current Outpatient Medications:  .  CALCIUM PO, Take by mouth., Disp: , Rfl:  .  cholecalciferol (VITAMIN D3) 25 MCG (1000 UNIT) tablet, Take 1,000 Units by mouth daily., Disp: , Rfl:  .  meloxicam (MOBIC) 15 MG tablet, TAKE 1 TABLET BY MOUTH EVERY DAY, Disp: 30 tablet, Rfl: 0 .  pantoprazole (PROTONIX) 40 MG tablet, Take 1 tablet (40 mg total) by mouth 2 (two) times daily., Disp: 180 tablet, Rfl: 3 .  triamcinolone cream (KENALOG) 0.1 %, Apply to affected areas on the legs as needed once daily, Disp: 30 g, Rfl: 1  No Known Allergies Review of Systems Objective:  There were no vitals filed for this visit.  General: Well developed, nourished, in no acute distress, alert and oriented x3   Dermatological: Skin is warm, dry and supple bilateral. Nails x 10 are well maintained; remaining integument appears unremarkable at this  time. There are no open sores, no preulcerative lesions, no rash or signs of infection present.  Vascular: Dorsalis Pedis artery and Posterior Tibial artery pedal pulses are 2/4 bilateral with immedate capillary fill time. Pedal hair growth present. No varicosities and no lower extremity edema present bilateral.   Neruologic: Grossly intact via light touch bilateral. Vibratory intact via tuning fork bilateral. Protective threshold with Semmes Wienstein monofilament intact to all pedal Stevens bilateral. Patellar and Achilles deep tendon reflexes 2+ bilateral. No Babinski or clonus noted bilateral.   Musculoskeletal: No gross boney pedal deformities bilateral. No pain, crepitus, or limitation noted with foot and ankle range of motion bilateral. Muscular strength 5/5 in all groups tested bilateral.  Hallux abductovalgus deformity of the right foot with increase in the first intermetatarsal angle greater than normal value and a hallux abductus angle greater than normal value with a hallux interphalangeal angle greater than normal value.  She also has a mild hammertoe deformity contraction of the second digit at the level of the metatarsophalangeal joint dorsally secondary to the encroachment of the hallux.  Due to this she has a prominent area of the tibial sesamoid which is tender and does demonstrate some fluctuance.  There is overlying reactive hyperkeratosis.  Gait: Unassisted, Nonantalgic.    Radiographs:  Radiographs taken today demonstrate  an osseously mature individual with an increase in the first intermetatarsal angle of the right foot over the left.  She has hallux interphalangeal as well.  No osteophytic changes are noted as of yet.  No acute findings noted.  Assessment & Plan:   Assessment: Hallux abductovalgus deformity hallux interphalangeal.  Contracted second digit of the right foot at the level of the metatarsophalangeal joint.  Bursitis subtibial sesamoid capsulitis  sesamoiditis.  Reactive hyperkeratotic lesion plantar aspect tibial sesamoid.  Plan: Debrided all reactive hyperkeratotic tissue.  I injected the bursitis today with 4 mg of Kenalog and local anesthetic.  We did this discussed in great detail today and [place] bunion repair a Akin osteotomy and a release of the second metatarsophalangeal joint.  She works in a [employer] and will need to wait until summer before having this done.  She will follow up with me in the mid March.  This is for consult.     Kimara Bencomo T. [place], [place]

## 2020-03-24 ENCOUNTER — Other Ambulatory Visit: Payer: Self-pay | Admitting: Family

## 2020-05-03 ENCOUNTER — Other Ambulatory Visit: Payer: Self-pay | Admitting: Family

## 2020-05-29 ENCOUNTER — Ambulatory Visit: Payer: 59 | Admitting: Podiatry

## 2020-06-04 ENCOUNTER — Other Ambulatory Visit: Payer: Self-pay | Admitting: Family

## 2020-07-05 ENCOUNTER — Other Ambulatory Visit: Payer: Self-pay | Admitting: Family

## 2020-07-06 ENCOUNTER — Other Ambulatory Visit: Payer: Self-pay | Admitting: Family

## 2020-07-06 DIAGNOSIS — K2100 Gastro-esophageal reflux disease with esophagitis, without bleeding: Secondary | ICD-10-CM

## 2020-07-17 ENCOUNTER — Encounter: Payer: Self-pay | Admitting: Podiatry

## 2020-07-17 ENCOUNTER — Ambulatory Visit: Payer: 59 | Admitting: Podiatry

## 2020-07-17 ENCOUNTER — Other Ambulatory Visit: Payer: Self-pay

## 2020-07-17 DIAGNOSIS — M2041 Other hammer toe(s) (acquired), right foot: Secondary | ICD-10-CM

## 2020-07-17 DIAGNOSIS — M722 Plantar fascial fibromatosis: Secondary | ICD-10-CM | POA: Diagnosis not present

## 2020-07-17 DIAGNOSIS — M2011 Hallux valgus (acquired), right foot: Secondary | ICD-10-CM

## 2020-07-17 DIAGNOSIS — M21961 Unspecified acquired deformity of right lower leg: Secondary | ICD-10-CM

## 2020-07-17 MED ORDER — TRIAMCINOLONE ACETONIDE 40 MG/ML IJ SUSP
20.0000 mg | Freq: Once | INTRAMUSCULAR | Status: AC
  Administered 2020-07-17: 20 mg

## 2020-07-17 NOTE — Progress Notes (Signed)
She presents today complaining of pain to the posterior aspect of her left heel left some plantarly as well.  The other reason for her visit is to sign surgical consent form for her right foot.  We had previously discussed this.  Objective: Vital signs are stable she alert oriented x3.  Pulses are palpable.  She has hallux abductovalgus deformity of the right foot with a hammertoe deformity #2 #3 the right foot.  Radiographs previously demonstrated plantarflexed second metatarsal increase in first intermetatarsal angle hallux abductus angle is increased.  All this leading to the necessity for surgical intervention.  Assessment: Pain in limb secondary to insertional Achilles tendinitis left.  Also hallux abductovalgus deformity plantarflexed second metatarsal hammertoe deformity #2 and #3 all of the right foot.  Plan: Discussed etiology pathology and surgical therapies this point time went ahead and consented her for an Liane Comber bunionectomy with screw fixation second metatarsal osteotomy with screw fixation hammertoe repair second right with pins fixation and a flexor tenotomy third.  She understands this and is amenable to it we will follow-up with me in the near future for surgical intervention.

## 2020-08-06 ENCOUNTER — Telehealth: Payer: Self-pay | Admitting: Urology

## 2020-08-06 NOTE — Telephone Encounter (Signed)
DOS - 08/24/20  [place] BUNIONECTOMY RIGHT --- 38329 METATARSAL OSTEOTOMY 2ND RIGHT --- 19166 TENOTOMY 3RD RIGHT --- 28010 HAMMERTOE REPAIR 2ND RIGHT --- 06004  [facility] EFFECTIVE DATE - 03/18/19  RECEIVED A FAX FROM [facility] FOR CPT CODES 59977, [phone], [phone] AND [id number] HAVE BEEN APPROVED, AUTH # [id number].

## 2020-08-07 ENCOUNTER — Other Ambulatory Visit: Payer: Self-pay | Admitting: Family

## 2020-08-23 ENCOUNTER — Telehealth: Payer: Self-pay

## 2020-08-23 ENCOUNTER — Other Ambulatory Visit: Payer: Self-pay | Admitting: Podiatry

## 2020-08-23 MED ORDER — OXYCODONE-ACETAMINOPHEN 10-325 MG PO TABS: 1.0000 | ORAL_TABLET | Freq: Three times a day (TID) | ORAL | 0 refills | Status: AC | PRN

## 2020-08-23 MED ORDER — ONDANSETRON HCL 4 MG PO TABS: 4.0000 mg | ORAL_TABLET | Freq: Three times a day (TID) | ORAL | 0 refills | Status: DC | PRN

## 2020-08-23 MED ORDER — CEPHALEXIN 500 MG PO CAPS: 500.0000 mg | ORAL_CAPSULE | Freq: Three times a day (TID) | ORAL | 0 refills | Status: DC

## 2020-08-23 NOTE — Telephone Encounter (Signed)
Heidi Stevens called to cancel her surgery with Dr. Milinda Pointer on 08/24/2020. She stated [facility] called her this morning to let her know she will need to pay $1800.00 tomorrow morning and she doesn't have it. She stated she will need to save up the money and will call me back to reschedule. I notified her that her pain medicine was already sent to the pharmacy and that if she filled the Rx Dr. Milinda Pointer will not call her in more when she reschedules. Notified Dr. Milinda Pointer and Caren Griffins with [facility]

## 2020-08-30 ENCOUNTER — Encounter: Payer: 59 | Admitting: Podiatry

## 2020-09-03 ENCOUNTER — Other Ambulatory Visit: Payer: Self-pay | Admitting: Family

## 2020-09-03 DIAGNOSIS — K2100 Gastro-esophageal reflux disease with esophagitis, without bleeding: Secondary | ICD-10-CM

## 2020-09-06 ENCOUNTER — Encounter: Payer: 59 | Admitting: Podiatry

## 2020-09-20 ENCOUNTER — Encounter: Payer: 59 | Admitting: Podiatry

## 2020-10-01 ENCOUNTER — Other Ambulatory Visit: Payer: Self-pay | Admitting: Family

## 2020-10-04 ENCOUNTER — Encounter: Payer: 59 | Admitting: Podiatry

## 2020-10-28 ENCOUNTER — Other Ambulatory Visit: Payer: Self-pay | Admitting: Family

## 2020-11-29 ENCOUNTER — Other Ambulatory Visit: Payer: Self-pay | Admitting: Family

## 2021-01-01 ENCOUNTER — Other Ambulatory Visit: Payer: Self-pay | Admitting: Family

## 2021-01-01 DIAGNOSIS — K2100 Gastro-esophageal reflux disease with esophagitis, without bleeding: Secondary | ICD-10-CM

## 2021-01-06 ENCOUNTER — Other Ambulatory Visit: Payer: Self-pay | Admitting: Family

## 2021-02-05 ENCOUNTER — Other Ambulatory Visit: Payer: Self-pay | Admitting: Family

## 2021-02-05 MED ORDER — MELOXICAM 15 MG PO TABS: 15.0000 mg | ORAL_TABLET | Freq: Every day | ORAL | 0 refills | Status: DC

## 2021-05-09 ENCOUNTER — Other Ambulatory Visit: Payer: Self-pay | Admitting: Family

## 2021-05-09 DIAGNOSIS — K2100 Gastro-esophageal reflux disease with esophagitis, without bleeding: Secondary | ICD-10-CM

## 2021-06-08 ENCOUNTER — Other Ambulatory Visit: Payer: Self-pay | Admitting: Family

## 2021-06-08 DIAGNOSIS — K2100 Gastro-esophageal reflux disease with esophagitis, without bleeding: Secondary | ICD-10-CM

## 2021-06-13 ENCOUNTER — Other Ambulatory Visit: Payer: Self-pay | Admitting: Family

## 2021-06-13 ENCOUNTER — Ambulatory Visit (INDEPENDENT_AMBULATORY_CARE_PROVIDER_SITE_OTHER): Payer: Self-pay | Admitting: Family

## 2021-06-13 ENCOUNTER — Encounter: Payer: Self-pay | Admitting: Family

## 2021-06-13 VITALS — BP 124/80 | HR 68 | Temp 98.4°F | Ht 62.0 in | Wt 222.8 lb

## 2021-06-13 DIAGNOSIS — J019 Acute sinusitis, unspecified: Secondary | ICD-10-CM

## 2021-06-13 MED ORDER — AMOXICILLIN-POT CLAVULANATE 875-125 MG PO TABS: 1.0000 | ORAL_TABLET | Freq: Two times a day (BID) | ORAL | 0 refills | Status: AC

## 2021-06-13 NOTE — Patient Instructions (Signed)
Please schedule a CPE;  ?

## 2021-06-13 NOTE — Progress Notes (Signed)
?Heidi Stevens is a 66 y.o. female with the following history as recorded in EpicCare:  ?Patient Active Problem List  ? Diagnosis Date Noted  ? Syncopal episodes 06/19/2015  ? Lightheadedness 06/19/2015  ? Routine general medical examination at a health care facility 09/21/2014  ? Central centrifugal scarring alopecia 09/05/2014  ? Primary localized osteoarthrosis, lower leg 07/11/2013  ? IT band syndrome 07/11/2013  ? Obesity, Class II, BMI 35-39.9 05/31/2013  ? Acute meniscal tear of knee, subsequent encounter 04/27/2013  ? Anemia   ? Fibroids   ? H/O sinusitis   ? GERD (gastroesophageal reflux disease) 10/24/2010  ? Healthcare maintenance 10/24/2010  ? ALLERGIC RHINITIS CAUSE UNSPECIFIED 07/03/2009  ?  ?Current Outpatient Medications  ?Medication Sig Dispense Refill  ? amoxicillin-clavulanate (AUGMENTIN) 875-125 MG tablet Take 1 tablet by mouth 2 (two) times daily for 10 days. 20 tablet 0  ? CALCIUM PO Take by mouth.    ? cholecalciferol (VITAMIN D3) 25 MCG (1000 UNIT) tablet Take 1,000 Units by mouth daily.    ? meloxicam (MOBIC) 15 MG tablet Take 1 tablet (15 mg total) by mouth daily. 30 tablet 0  ? ondansetron (ZOFRAN) 4 MG tablet Take 1 tablet (4 mg total) by mouth every 8 (eight) hours as needed. 20 tablet 0  ? pantoprazole (PROTONIX) 40 MG tablet TAKE 1 TABLET BY MOUTH TWICE A DAY 60 tablet 0  ? triamcinolone cream (KENALOG) 0.1 % Apply to affected areas on the legs as needed once daily 30 g 1  ? ?No current facility-administered medications for this visit.  ?  ?Allergies: Patient has no known allergies.  ?Past Medical History:  ?Diagnosis Date  ? Anemia   ? Arthritis   ? hip, right knee   ? Fibroids   ? GERD (gastroesophageal reflux disease)   ? H/O menorrhagia   ? H/O sinusitis   ? Syncope   ? 2017 due to dehydration   ?  ?Past Surgical History:  ?Procedure Laterality Date  ? arthrsocopy knee    ? right knee in '04  ? BREAST EXCISIONAL BIOPSY Right   ? BREAST SURGERY  1985  ? reduction mammoplsty  ?  COLONOSCOPY    ? G2P2    ? REDUCTION MAMMAPLASTY Bilateral 1985  ? TOTAL ABDOMINAL HYSTERECTOMY W/ BILATERAL SALPINGOOPHORECTOMY  07/06/07  ? [place] EXTRACTION  1980  ?  ?Family History  ?Problem Relation Age of Onset  ? Diabetes Mother   ? Hypertension Mother   ? Stroke Mother   ? Hypertension Father   ? Diabetes Father   ? Stroke Father   ? Liver cancer Father   ? Diabetes Paternal Grandmother   ? Asthma Son   ? Breast cancer Sister   ? Colon cancer Neg Hx   ? Colon polyps Neg Hx   ? Esophageal cancer Neg Hx   ? Rectal cancer Neg Hx   ? Stomach cancer Neg Hx   ?  ?Social History  ? ?Tobacco Use  ? Smoking status: Former  ? Smokeless tobacco: Never  ?Substance Use Topics  ? Alcohol use: No  ?  ?Subjective:  ? ?Presents today with concerns for possible sinus infection; symptoms present x 1 week; using OTC Aleve and Mucinex; +facial pressure;  ? ? ? ? ?Objective:  ?Vitals:  ? 06/13/21 1132  ?BP: 124/80  ?Pulse: 68  ?Temp: 98.4 ?F (36.9 ?C)  ?TempSrc: Oral  ?SpO2: 97%  ?Weight: 222 lb 12.8 oz (101.1 kg)  ?Height: '5\' 2"'$  (1.575  m)  ?  ?General: Well developed, well nourished, in no acute distress  ?Skin : Warm and dry.  ?Head: Normocephalic and atraumatic  ?Eyes: Sclera and conjunctiva clear; pupils round and reactive to light; extraocular movements intact  ?Ears: External normal; canals clear; tympanic membranes normal  ?Oropharynx: Pink, supple. No suspicious lesions  ?Neck: Supple without thyromegaly, adenopathy  ?Lungs: Respirations unlabored;  ?Neurologic: Alert and oriented; speech intact; face symmetrical; moves all extremities well; CNII-XII intact without focal deficit  ? ?Assessment:  ?1. Acute sinusitis, recurrence not specified, unspecified location   ?  ?Plan:  ?Rx for Augmentin 875 mg bid x 10 days; continue Aleve and Mucinex; increase fluids, rest and follow up worse, no better; ? ?This visit occurred during the SARS-CoV-2 public health emergency.  Safety protocols were in place, including screening  questions prior to the visit, additional usage of staff PPE, and extensive cleaning of exam room while observing appropriate contact time as indicated for disinfecting solutions.  ? ? ?No follow-ups on file.  ?No orders of the defined types were placed in this encounter. ?  ?Requested Prescriptions  ? ?Signed Prescriptions Disp Refills  ? amoxicillin-clavulanate (AUGMENTIN) 875-125 MG tablet 20 tablet 0  ?  Sig: Take 1 tablet by mouth 2 (two) times daily for 10 days.  ?  ? ?

## 2021-08-01 ENCOUNTER — Other Ambulatory Visit: Payer: Self-pay | Admitting: Family

## 2021-08-01 ENCOUNTER — Ambulatory Visit
Admission: RE | Admit: 2021-08-01 | Discharge: 2021-08-01 | Disposition: A | Discharge status: Home / Self Care | Payer: BC Managed Care – PPO | Source: Ambulatory Visit | Attending: Family | Admitting: Family

## 2021-08-01 DIAGNOSIS — Z1231 Encounter for screening mammogram for malignant neoplasm of breast: Secondary | ICD-10-CM

## 2021-08-16 ENCOUNTER — Other Ambulatory Visit: Payer: Self-pay | Admitting: Family

## 2021-08-16 DIAGNOSIS — K2100 Gastro-esophageal reflux disease with esophagitis, without bleeding: Secondary | ICD-10-CM

## 2021-09-02 ENCOUNTER — Ambulatory Visit: Payer: BC Managed Care – PPO | Admitting: Family Medicine

## 2021-09-11 ENCOUNTER — Other Ambulatory Visit: Payer: Self-pay | Admitting: Family

## 2021-09-11 DIAGNOSIS — K2100 Gastro-esophageal reflux disease with esophagitis, without bleeding: Secondary | ICD-10-CM

## 2021-09-20 ENCOUNTER — Encounter: Payer: BC Managed Care – PPO | Admitting: Family

## 2021-12-10 ENCOUNTER — Other Ambulatory Visit: Payer: Self-pay | Admitting: Family

## 2021-12-10 DIAGNOSIS — K2100 Gastro-esophageal reflux disease with esophagitis, without bleeding: Secondary | ICD-10-CM

## 2021-12-26 ENCOUNTER — Ambulatory Visit: Payer: BC Managed Care – PPO | Admitting: Family Medicine

## 2021-12-26 VITALS — BP 139/82 | HR 71 | Temp 97.5°F | Ht 62.0 in | Wt 226.0 lb

## 2021-12-26 DIAGNOSIS — Z23 Encounter for immunization: Secondary | ICD-10-CM | POA: Diagnosis not present

## 2021-12-26 DIAGNOSIS — M1990 Unspecified osteoarthritis, unspecified site: Secondary | ICD-10-CM | POA: Diagnosis not present

## 2021-12-26 DIAGNOSIS — L0390 Cellulitis, unspecified: Secondary | ICD-10-CM | POA: Diagnosis not present

## 2021-12-26 MED ORDER — DOXYCYCLINE HYCLATE 100 MG PO TABS: 100.0000 mg | ORAL_TABLET | Freq: Two times a day (BID) | ORAL | 0 refills | Status: DC

## 2021-12-26 MED ORDER — TRIAMCINOLONE ACETONIDE 0.5 % EX CREA: 1.0000 | TOPICAL_CREAM | Freq: Three times a day (TID) | CUTANEOUS | 0 refills | Status: DC

## 2021-12-26 MED ORDER — MELOXICAM 15 MG PO TABS: 15.0000 mg | ORAL_TABLET | Freq: Every day | ORAL | 0 refills | Status: DC

## 2021-12-26 NOTE — Patient Instructions (Signed)
It was very nice to see you today!  I am concerned that she had a skin infection.  This probably started as an insect bite.  Please start the doxycycline and triamcinolone.  I will refill your meloxicam today.  We will give you a flu shot today.  Let us know if not improving by next week.  Take care, Dr Jerline Pain  PLEASE NOTE:  If you had any lab tests please let us know if you have not heard back within a few days. You may see your results on mychart before we have a chance to review them but we will give you a call once they are reviewed by [place]. If we ordered any referrals today, please let us know if you have not heard from their office within the next week.   Please try these tips to maintain a healthy lifestyle:  Eat at least 3 REAL meals and 1-2 snacks per day.  Aim for no more than 5 hours between eating.  If you eat breakfast, please do so within one hour of getting up.   Each meal should contain half fruits/vegetables, one quarter protein, and one quarter carbs (no bigger than a computer mouse)  Cut down on sweet beverages. This includes juice, soda, and sweet tea.   Drink at least 1 glass of water with each meal and aim for at least 8 glasses per day  Exercise at least 150 minutes every week.

## 2021-12-26 NOTE — Progress Notes (Signed)
   Heidi Stevens is a 66 y.o. female who presents today for an office visit.  Assessment/Plan:  New/Acute Problems: Cellulitis  No red flags.  No signs of systemic illness.  Likely had an arthropod bite initially about a week ago but has subsequently developed some surrounding cellulitis.  We will start doxycycline.  She is having a fair degree of pruritus as well.  We will start topical triamcinolone for this.  She has had this for eczema in the past and has done well with this.  We discussed reasons to return to care and seek emergent care.  Follow-up as needed.  Chronic Problems Addressed Today: Osteoarthritis Has significant pain in the left knee.  She request refill on meloxicam today.  Will refill today.  She will need to follow-up with PCP for ongoing care.    Subjective:  HPI:  PAtient here with rash behind her right ear. Started about a week ago. Initially started as a pimple.  Area was very itchy.  She scratched it.  Since then she has noticed increasing redness, pain, and itching to the area.  She has tried using topical graspable to the area without much improvement.  She would also like to have a refill on her meloxicam today.  She has had a flareup refer arthritis recently.       Objective:  Physical Exam: BP 139/82   Pulse 71   Temp (!) 97.5 F (36.4 C) (Temporal)   Ht '5\' 2"'$  (1.575 m)   Wt 226 lb (102.5 kg)   SpO2 96%   BMI 41.34 kg/m   Gen: No acute distress, resting comfortably Skin: Excoriated erythematous nodule the right Story process.  Surrounding erythema.  Tenderness to palpation.  No purulent drainage noted.  Diffuse surrounding erythematous papules noted. Neuro: Grossly normal, moves all extremities Psych: Normal affect and thought content      Heavenly Christine M. Jerline Pain, MD 12/26/2021 2:33 PM

## 2022-02-14 ENCOUNTER — Ambulatory Visit (INDEPENDENT_AMBULATORY_CARE_PROVIDER_SITE_OTHER): Payer: BC Managed Care – PPO | Admitting: Family

## 2022-02-14 ENCOUNTER — Encounter: Payer: Self-pay | Admitting: Family

## 2022-02-14 VITALS — BP 128/78 | HR 79 | Temp 98.1°F | Resp 18 | Ht 62.0 in | Wt 230.0 lb

## 2022-02-14 DIAGNOSIS — R7309 Other abnormal glucose: Secondary | ICD-10-CM | POA: Diagnosis not present

## 2022-02-14 DIAGNOSIS — M545 Low back pain, unspecified: Secondary | ICD-10-CM | POA: Diagnosis not present

## 2022-02-14 DIAGNOSIS — R053 Chronic cough: Secondary | ICD-10-CM | POA: Diagnosis not present

## 2022-02-14 DIAGNOSIS — Z13220 Encounter for screening for lipoid disorders: Secondary | ICD-10-CM | POA: Diagnosis not present

## 2022-02-14 DIAGNOSIS — G8929 Other chronic pain: Secondary | ICD-10-CM

## 2022-02-14 DIAGNOSIS — Z0000 Encounter for general adult medical examination without abnormal findings: Secondary | ICD-10-CM

## 2022-02-14 DIAGNOSIS — K2100 Gastro-esophageal reflux disease with esophagitis, without bleeding: Secondary | ICD-10-CM | POA: Diagnosis not present

## 2022-02-14 MED ORDER — MELOXICAM 15 MG PO TABS: 15.0000 mg | ORAL_TABLET | Freq: Every day | ORAL | 1 refills | Status: DC

## 2022-02-14 MED ORDER — FLUTICASONE-SALMETEROL 250-50 MCG/ACT IN AEPB: 1.0000 | INHALATION_SPRAY | Freq: Two times a day (BID) | RESPIRATORY_TRACT | 0 refills | Status: DC

## 2022-02-14 MED ORDER — PANTOPRAZOLE SODIUM 40 MG PO TBEC: 40.0000 mg | DELAYED_RELEASE_TABLET | Freq: Two times a day (BID) | ORAL | 3 refills | Status: DC

## 2022-02-14 NOTE — Progress Notes (Signed)
Heidi Stevens is a 66 y.o. female with the following history as recorded in EpicCare:  Patient Active Problem List   Diagnosis Date Noted   Syncopal episodes 06/19/2015   Lightheadedness 06/19/2015   Routine general medical examination at a health care facility 09/21/2014   Central centrifugal scarring alopecia 09/05/2014   Primary localized osteoarthrosis, lower leg 07/11/2013   IT band syndrome 07/11/2013   Obesity, Class II, BMI 35-39.9 05/31/2013   Acute meniscal tear of knee, subsequent encounter 04/27/2013   Anemia    Fibroids    H/O sinusitis    GERD (gastroesophageal reflux disease) 10/24/2010   Healthcare maintenance 10/24/2010   ALLERGIC RHINITIS CAUSE UNSPECIFIED 07/03/2009    Current Outpatient Medications  Medication Sig Dispense Refill   CALCIUM PO Take by mouth.     cholecalciferol (VITAMIN D3) 25 MCG (1000 UNIT) tablet Take 1,000 Units by mouth daily.     fluticasone-salmeterol (ADVAIR DISKUS) 250-50 MCG/ACT AEPB Inhale 1 puff into the lungs in the morning and at bedtime. 60 each 0   ondansetron (ZOFRAN) 4 MG tablet Take 1 tablet (4 mg total) by mouth every 8 (eight) hours as needed. 20 tablet 0   triamcinolone cream (KENALOG) 0.5 % Apply 1 Application topically 3 (three) times daily. 30 g 0   meloxicam (MOBIC) 15 MG tablet Take 1 tablet (15 mg total) by mouth daily. 30 tablet 1   pantoprazole (PROTONIX) 40 MG tablet Take 1 tablet (40 mg total) by mouth 2 (two) times daily. 180 tablet 3   No current facility-administered medications for this visit.    Allergies: Patient has no known allergies.  Past Medical History:  Diagnosis Date   Anemia    Arthritis    hip, right knee    Fibroids    GERD (gastroesophageal reflux disease)    H/O menorrhagia    H/O sinusitis    Syncope    2017 due to dehydration     Past Surgical History:  Procedure Laterality Date   arthrsocopy knee     right knee in '04   BREAST EXCISIONAL BIOPSY Right    BREAST SURGERY  1985    reduction mammoplsty   COLONOSCOPY     G2P2     REDUCTION MAMMAPLASTY Bilateral 1985   TOTAL ABDOMINAL HYSTERECTOMY W/ BILATERAL SALPINGOOPHORECTOMY  07/06/07   WISDOM TOOTH EXTRACTION  1980    Family History  Problem Relation Age of Onset   Diabetes Mother    Hypertension Mother    Stroke Mother    Hypertension Father    Diabetes Father    Stroke Father    Liver cancer Father    Diabetes Paternal Grandmother    Asthma Son    Breast cancer Sister    Colon cancer Neg Hx    Colon polyps Neg Hx    Esophageal cancer Neg Hx    Rectal cancer Neg Hx    Stomach cancer Neg Hx     Social History   Tobacco Use   Smoking status: Former   Smokeless tobacco: Never  Substance Use Topics   Alcohol use: No    Subjective:  Presents for yearly CPE exam; is not fasting today and prefers to check labs at a later date;   Is concerned about 3 week history of cough/ wheezing- worried about second hand smoke exposure- asking for imaging;  Also continuing to struggle with low back pain/ bilateral hip pain; no numbness/ tingling- would like refill on Mobic;   Review of  Systems  Constitutional: Negative.   HENT: Negative.    Eyes: Negative.   Respiratory:  Positive for cough and wheezing.   Cardiovascular: Negative.   Gastrointestinal: Negative.   Genitourinary: Negative.   Musculoskeletal: Negative.   Skin: Negative.   Neurological: Negative.   Endo/Heme/Allergies: Negative.   Psychiatric/Behavioral: Negative.       Objective:  Vitals:   02/14/22 1401  BP: 128/78  Pulse: 79  Resp: 18  Temp: 98.1 F (36.7 C)  TempSrc: Oral  SpO2: 97%  Weight: 230 lb (104.3 kg)  Height: _0  (1.575 m)    General: Well developed, well nourished, in no acute distress  Skin : Warm and dry.  Head: Normocephalic and atraumatic  Eyes: Sclera and conjunctiva clear; pupils round and reactive to light; extraocular movements intact  Ears: External normal; canals clear; tympanic membranes normal   Oropharynx: Pink, supple. No suspicious lesions  Neck: Supple without thyromegaly, adenopathy  Lungs: Respirations unlabored; clear to auscultation bilaterally without wheeze, rales, rhonchi  CVS exam: normal rate and regular rhythm.  Abdomen: Soft; nontender; nondistended; normoactive bowel sounds; no masses or hepatosplenomegaly  Musculoskeletal: No deformities; no active joint inflammation  Extremities: No edema, cyanosis, clubbing  Vessels: Symmetric bilaterally  Neurologic: Alert and oriented; speech intact; face symmetrical; moves all extremities well; CNII-XII intact without focal deficit   Assessment:  1. PE (physical exam), annual   2. Lipid screening   3. Elevated glucose   4. Gastroesophageal reflux disease with esophagitis without hemorrhage   5. Persistent cough   6. Chronic low back pain without sciatica, unspecified back pain laterality     Plan:  Age appropriate preventive healthcare needs addressed; encouraged regular eye doctor and dental exams; encouraged regular exercise; will update labs and refills as needed today; follow-up to be determined; She will go to [facility] at later date to get fasting labs updated;   Will update CXR due to persistent cough; do not feel antibiotics are warranted; short-term trial of Advair; follow up to be determined;  Will update lumbar and bilateral hip x-rays- refill updated on Mobic; will most likely need to consider referral to orthopedist;   No follow-ups on file.  Orders Placed This Encounter  Procedures   DG Lumbar Spine Complete    Standing Status:   Future    Standing Expiration Date:   02/15/2023    Order Specific Question:   Reason for Exam (SYMPTOM  OR DIAGNOSIS REQUIRED)    Answer:   low back pain    Order Specific Question:   Preferred imaging location?    Answer:   [facility]   DG Chest 2 View    Standing Status:   Future    Standing Expiration Date:   02/15/2023    Order Specific Question:   Reason for Exam  (SYMPTOM  OR DIAGNOSIS REQUIRED)    Answer:   cough/ wheezing    Order Specific Question:   Preferred imaging location?    Answer:   [facility]   DG Pelvis 1-2 Views    Standing Status:   Future    Standing Expiration Date:   08/16/2022    Order Specific Question:   Reason for Exam (SYMPTOM  OR DIAGNOSIS REQUIRED)    Answer:   bilateral hip pain    Order Specific Question:   Preferred imaging location?    Answer:   [facility] [address]   CBC with Differential/Platelet    Standing Status:   Future    Standing Expiration  Date:   02/15/2023   Comp Met (CMET)    Standing Status:   Future    Standing Expiration Date:   02/15/2023   Lipid panel    Standing Status:   Future    Standing Expiration Date:   02/15/2023   TSH    Standing Status:   Future    Standing Expiration Date:   02/15/2023   Hemoglobin A1c    Standing Status:   Future    Standing Expiration Date:   02/15/2023    Requested Prescriptions   Signed Prescriptions Disp Refills   meloxicam (MOBIC) 15 MG tablet 30 tablet 1    Sig: Take 1 tablet (15 mg total) by mouth daily.   pantoprazole (PROTONIX) 40 MG tablet 180 tablet 3    Sig: Take 1 tablet (40 mg total) by mouth 2 (two) times daily.   fluticasone-salmeterol (ADVAIR DISKUS) 250-50 MCG/ACT AEPB 60 each 0    Sig: Inhale 1 puff into the lungs in the morning and at bedtime.

## 2022-02-17 ENCOUNTER — Other Ambulatory Visit (INDEPENDENT_AMBULATORY_CARE_PROVIDER_SITE_OTHER): Payer: BC Managed Care – PPO

## 2022-02-17 ENCOUNTER — Ambulatory Visit (INDEPENDENT_AMBULATORY_CARE_PROVIDER_SITE_OTHER)
Admission: RE | Admit: 2022-02-17 | Discharge: 2022-02-17 | Disposition: A | Discharge status: Home / Self Care | Payer: BC Managed Care – PPO | Source: Ambulatory Visit | Attending: Family | Admitting: Family

## 2022-02-17 DIAGNOSIS — R053 Chronic cough: Secondary | ICD-10-CM

## 2022-02-17 DIAGNOSIS — M545 Low back pain, unspecified: Secondary | ICD-10-CM

## 2022-02-17 DIAGNOSIS — Z0000 Encounter for general adult medical examination without abnormal findings: Secondary | ICD-10-CM

## 2022-02-17 DIAGNOSIS — R7309 Other abnormal glucose: Secondary | ICD-10-CM

## 2022-02-17 DIAGNOSIS — G8929 Other chronic pain: Secondary | ICD-10-CM

## 2022-02-17 DIAGNOSIS — Z13220 Encounter for screening for lipoid disorders: Secondary | ICD-10-CM | POA: Diagnosis not present

## 2022-02-17 LAB — CBC WITH DIFFERENTIAL/PLATELET
Basophils Absolute: 0 10*3/uL (ref 0.0–0.1)
Basophils Relative: 0.6 % (ref 0.0–3.0)
Eosinophils Absolute: 0.2 10*3/uL (ref 0.0–0.7)
Eosinophils Relative: 4.1 % (ref 0.0–5.0)
HCT: 38.4 % (ref 36.0–46.0)
Hemoglobin: 12.7 g/dL (ref 12.0–15.0)
Lymphocytes Relative: 31.5 % (ref 12.0–46.0)
Lymphs Abs: 1.8 10*3/uL (ref 0.7–4.0)
MCHC: 33.2 g/dL (ref 30.0–36.0)
MCV: 83.2 fl (ref 78.0–100.0)
Monocytes Absolute: 0.5 10*3/uL (ref 0.1–1.0)
Monocytes Relative: 9 % (ref 3.0–12.0)
Neutro Abs: 3.1 10*3/uL (ref 1.4–7.7)
Neutrophils Relative %: 54.8 % (ref 43.0–77.0)
Platelets: 323 10*3/uL (ref 150.0–400.0)
RBC: 4.61 Mil/uL (ref 3.87–5.11)
RDW: 15.3 % (ref 11.5–15.5)
WBC: 5.7 10*3/uL (ref 4.0–10.5)

## 2022-02-17 LAB — COMPREHENSIVE METABOLIC PANEL
ALT: 11 U/L (ref 0–35)
AST: 16 U/L (ref 0–37)
Albumin: 3.8 g/dL (ref 3.5–5.2)
Alkaline Phosphatase: 70 U/L (ref 39–117)
BUN: 17 mg/dL (ref 6–23)
CO2: 29 mEq/L (ref 19–32)
Calcium: 8.9 mg/dL (ref 8.4–10.5)
Chloride: 104 mEq/L (ref 96–112)
Creatinine, Ser: 1.02 mg/dL (ref 0.40–1.20)
GFR: 58.24 mL/min — ABNORMAL LOW (ref >60.00)
Glucose, Bld: 93 mg/dL (ref 70–99)
Potassium: 4.2 mEq/L (ref 3.5–5.1)
Sodium: 139 mEq/L (ref 135–145)
Total Bilirubin: 0.4 mg/dL (ref 0.2–1.2)
Total Protein: 7.6 g/dL (ref 6.0–8.3)

## 2022-02-17 LAB — LIPID PANEL
Cholesterol: 168 mg/dL (ref 0–200)
HDL: 51.2 mg/dL (ref >39.00)
LDL Cholesterol: 104 mg/dL — ABNORMAL HIGH (ref 0–99)
NonHDL: 117.06
Total CHOL/HDL Ratio: 3
Triglycerides: 66 mg/dL (ref 0.0–149.0)
VLDL: 13.2 mg/dL (ref 0.0–40.0)

## 2022-02-17 LAB — HEMOGLOBIN A1C: Hgb A1c MFr Bld: 6.1 % (ref 4.6–6.5)

## 2022-02-17 LAB — TSH: TSH: 2.94 u[IU]/mL (ref 0.35–5.50)

## 2022-02-20 ENCOUNTER — Other Ambulatory Visit: Payer: Self-pay | Admitting: Family

## 2022-02-20 DIAGNOSIS — G8929 Other chronic pain: Secondary | ICD-10-CM

## 2022-03-07 ENCOUNTER — Ambulatory Visit: Payer: BC Managed Care – PPO | Admitting: Orthopedic Surgery

## 2022-03-12 ENCOUNTER — Ambulatory Visit (INDEPENDENT_AMBULATORY_CARE_PROVIDER_SITE_OTHER): Payer: BC Managed Care – PPO | Admitting: Family Medicine

## 2022-03-12 ENCOUNTER — Encounter: Payer: Self-pay | Admitting: Family Medicine

## 2022-03-12 VITALS — BP 122/76 | HR 63 | Temp 98.1°F | Resp 18 | Ht 62.0 in | Wt 220.0 lb

## 2022-03-12 DIAGNOSIS — J329 Chronic sinusitis, unspecified: Secondary | ICD-10-CM | POA: Diagnosis not present

## 2022-03-12 DIAGNOSIS — R059 Cough, unspecified: Secondary | ICD-10-CM | POA: Diagnosis not present

## 2022-03-12 DIAGNOSIS — J40 Bronchitis, not specified as acute or chronic: Secondary | ICD-10-CM | POA: Diagnosis not present

## 2022-03-12 LAB — POCT INFLUENZA A/B
Influenza A, POC: NEGATIVE
Influenza B, POC: NEGATIVE

## 2022-03-12 LAB — POC COVID19 BINAXNOW: SARS Coronavirus 2 Ag: NEGATIVE

## 2022-03-12 MED ORDER — AMOXICILLIN-POT CLAVULANATE 875-125 MG PO TABS: 1.0000 | ORAL_TABLET | Freq: Two times a day (BID) | ORAL | 0 refills | Status: DC

## 2022-03-12 MED ORDER — ALBUTEROL SULFATE HFA 108 (90 BASE) MCG/ACT IN AERS: 2.0000 | INHALATION_SPRAY | Freq: Four times a day (QID) | RESPIRATORY_TRACT | 0 refills | Status: DC | PRN

## 2022-03-12 MED ORDER — GUAIFENESIN ER 600 MG PO TB12: 1200.0000 mg | ORAL_TABLET | Freq: Two times a day (BID) | ORAL | 1 refills | Status: DC

## 2022-03-12 MED ORDER — PREDNISONE 20 MG PO TABS: 40.0000 mg | ORAL_TABLET | Freq: Every day | ORAL | 0 refills | Status: AC

## 2022-03-12 MED ORDER — FLUTICASONE PROPIONATE 50 MCG/ACT NA SUSP: 2.0000 | Freq: Every day | NASAL | 6 refills | Status: DC

## 2022-03-12 NOTE — Progress Notes (Signed)
Acute Office Visit  Subjective:     Patient ID: Heidi Stevens, female    DOB: 1955/09/15, 66 y.o.   MRN: [id number]  Chief Complaint  Patient presents with   Wheezing   Cough   Generalized Body Aches    Started Friday    Fever    HPI Patient is in today for URI symptoms.  Patient reports 6-7 days of productive cough, occasional wheezing, sore throat, headache, fatigue. She did initially have fever 101F, chills, body aches, vomiting. No known sick contacts. She has had minimal improvement with OTC medications. She denies chest pain, dyspnea, rhinorrhea, hemoptysis.      ROS All review of systems negative except what is listed in the HPI      Objective:    BP 122/76   Pulse 63   Temp 98.1 F (36.7 C)   Resp 18   Ht '5\' 2"'$  (1.575 m)   Wt 220 lb (99.8 kg)   SpO2 97%   BMI 40.24 kg/m    Physical Exam Vitals reviewed.  Constitutional:      Appearance: Normal appearance.  HENT:     Head: Normocephalic and atraumatic.  Cardiovascular:     Rate and Rhythm: Normal rate and regular rhythm.     Pulses: Normal pulses.     Heart sounds: Normal heart sounds.  Pulmonary:     Effort: Pulmonary effort is normal.     Breath sounds: Normal breath sounds.  Musculoskeletal:     Cervical back: Normal range of motion and neck supple. No tenderness.  Lymphadenopathy:     Cervical: No cervical adenopathy.  Skin:    General: Skin is warm and dry.  Neurological:     Mental Status: She is alert and oriented to person, place, and time.  Psychiatric:        Mood and Affect: Mood normal.        Behavior: Behavior normal.        Thought Content: Thought content normal.        Judgment: Judgment normal.     Results for orders placed or performed in visit on 03/12/22  POC COVID-19 BinaxNow  Result Value Ref Range   SARS Coronavirus 2 Ag Negative Negative  POCT Influenza A/B  Result Value Ref Range   Influenza A, POC Negative Negative   Influenza B, POC Negative  Negative        Assessment & Plan:   Problem List Items Addressed This Visit   None Visit Diagnoses     Cough, unspecified type    -  Primary   Relevant Orders   POC COVID-19 BinaxNow (Completed)   POCT Influenza A/B (Completed)   Sinobronchitis       Relevant Medications   predniSONE (DELTASONE) 20 MG tablet   amoxicillin-clavulanate (AUGMENTIN) 875-125 MG tablet   fluticasone (FLONASE) 50 MCG/ACT nasal spray   albuterol (VENTOLIN HFA) 108 (90 Base) MCG/ACT inhaler   guaiFENesin (MUCINEX) 600 MG 12 hr tablet      Negative Flu and COVID today. Start prednisone, Flonase, Mucinex. You can use as needed albuterol inhaler for wheezing, shortness of breath.  If not making progress in the next 2 days, start the antibiotics (Augmentin).  Continue supportive measures including rest, hydration, humidifier use, steam showers, warm compresses to sinuses, warm liquids with lemon and honey, and over-the-counter cough, cold, and analgesics as needed.   Please contact office for follow-up if symptoms do not improve or worsen. Seek emergency care if  symptoms become severe.   Meds ordered this encounter  Medications   predniSONE (DELTASONE) 20 MG tablet    Sig: Take 2 tablets (40 mg total) by mouth daily with breakfast for 5 days.    Dispense:  10 tablet    Refill:  0    Order Specific Question:   Supervising Provider    Answer:   Penni Homans A [[id number]]   amoxicillin-clavulanate (AUGMENTIN) 875-125 MG tablet    Sig: Take 1 tablet by mouth 2 (two) times daily.    Dispense:  20 tablet    Refill:  0    Order Specific Question:   Supervising Provider    Answer:   Penni Homans A [[id number]]   fluticasone (FLONASE) 50 MCG/ACT nasal spray    Sig: Place 2 sprays into both nostrils daily.    Dispense:  16 g    Refill:  6    Order Specific Question:   Supervising Provider    Answer:   Penni Homans A [[id number]]   albuterol (VENTOLIN HFA) 108 (90 Base) MCG/ACT inhaler    Sig: Inhale 2 puffs into  the lungs every 6 (six) hours as needed for wheezing or shortness of breath.    Dispense:  8 g    Refill:  0    Order Specific Question:   Supervising Provider    Answer:   Penni Homans A [[id number]]   guaiFENesin (MUCINEX) 600 MG 12 hr tablet    Sig: Take 2 tablets (1,200 mg total) by mouth 2 (two) times daily.    Dispense:  30 tablet    Refill:  1    Order Specific Question:   Supervising Provider    Answer:   Penni Homans A [[id number]]    Return if symptoms worsen or fail to improve.  Terrilyn Saver, NP

## 2022-03-12 NOTE — Patient Instructions (Addendum)
Start prednisone, Flonase, Mucinex. You can use as needed albuterol inhaler for wheezing, shortness of breath.  If not making progress in the next 2 days, start the antibiotics (Augmentin).  Continue supportive measures including rest, hydration, humidifier use, steam showers, warm compresses to sinuses, warm liquids with lemon and honey, and over-the-counter cough, cold, and analgesics as needed.   Please contact office for follow-up if symptoms do not improve or worsen. Seek emergency care if symptoms become severe.

## 2022-03-24 ENCOUNTER — Other Ambulatory Visit: Payer: Self-pay | Admitting: Family

## 2022-04-01 ENCOUNTER — Encounter: Payer: Self-pay | Admitting: Family

## 2022-04-01 ENCOUNTER — Ambulatory Visit: Payer: BC Managed Care – PPO | Admitting: Family

## 2022-04-01 ENCOUNTER — Other Ambulatory Visit: Payer: Self-pay | Admitting: Family

## 2022-04-01 VITALS — BP 132/74 | HR 80 | Temp 98.3°F | Resp 18 | Ht 62.0 in | Wt 221.0 lb

## 2022-04-01 DIAGNOSIS — R058 Other specified cough: Secondary | ICD-10-CM

## 2022-04-01 DIAGNOSIS — R062 Wheezing: Secondary | ICD-10-CM

## 2022-04-01 MED ORDER — PREDNISONE 10 MG PO TABS: ORAL_TABLET | ORAL | 0 refills | Status: DC

## 2022-04-01 MED ORDER — AZITHROMYCIN 250 MG PO TABS: ORAL_TABLET | ORAL | 0 refills | Status: DC

## 2022-04-01 MED ORDER — PREDNISONE 20 MG PO TABS: ORAL_TABLET | ORAL | 0 refills | Status: DC

## 2022-04-01 MED ORDER — FLUTICASONE FUROATE-VILANTEROL 200-25 MCG/ACT IN AEPB: 1.0000 | INHALATION_SPRAY | Freq: Every day | RESPIRATORY_TRACT | 1 refills | Status: DC

## 2022-04-01 MED ORDER — ALBUTEROL SULFATE (2.5 MG/3ML) 0.083% IN NEBU: 2.5000 mg | INHALATION_SOLUTION | Freq: Four times a day (QID) | RESPIRATORY_TRACT | 1 refills | Status: DC | PRN

## 2022-04-01 NOTE — Progress Notes (Signed)
Heidi Stevens is a 67 y.o. female with the following history as recorded in [employer]:  Patient Active Problem List   Diagnosis Date Noted   Syncopal episodes 06/19/2015   Lightheadedness 06/19/2015   Routine general medical examination at a health care facility 09/21/2014   Central centrifugal scarring alopecia 09/05/2014   Primary localized osteoarthrosis, lower leg 07/11/2013   IT band syndrome 07/11/2013   Obesity, Class II, BMI 35-39.9 05/31/2013   Acute meniscal tear of knee, subsequent encounter 04/27/2013   Anemia    Fibroids    H/O sinusitis    GERD (gastroesophageal reflux disease) 10/24/2010   Healthcare maintenance 10/24/2010   ALLERGIC RHINITIS CAUSE UNSPECIFIED 07/03/2009    Current Outpatient Medications  Medication Sig Dispense Refill   albuterol (PROVENTIL) (2.5 MG/3ML) 0.083% nebulizer solution Take 3 mLs (2.5 mg total) by nebulization every 6 (six) hours as needed for wheezing or shortness of breath. 150 mL 1   albuterol (VENTOLIN HFA) 108 (90 Base) MCG/ACT inhaler Inhale 2 puffs into the lungs every 6 (six) hours as needed for wheezing or shortness of breath. 8 g 0   azithromycin (ZITHROMAX Z-PAK) 250 MG tablet Take 2 tablets (500 mg) PO today, then 1 tablet (250 mg) PO daily x4 days. 6 tablet 0   CALCIUM PO Take by mouth.     cholecalciferol (VITAMIN D3) 25 MCG (1000 UNIT) tablet Take 1,000 Units by mouth daily.     fluticasone (FLONASE) 50 MCG/ACT nasal spray Place 2 sprays into both nostrils daily. 16 g 6   fluticasone furoate-vilanterol (BREO ELLIPTA) 200-25 MCG/ACT AEPB Inhale 1 puff into the lungs daily. 28 each 1   fluticasone-salmeterol (ADVAIR) 250-50 MCG/ACT AEPB Inhale 1 puff into the lungs in the morning and at bedtime. 60 each 0   meloxicam (MOBIC) 15 MG tablet Take 1 tablet (15 mg total) by mouth daily. 30 tablet 1   ondansetron (ZOFRAN) 4 MG tablet Take 1 tablet (4 mg total) by mouth every 8 (eight) hours as needed. 20 tablet 0   pantoprazole  (PROTONIX) 40 MG tablet Take 1 tablet (40 mg total) by mouth 2 (two) times daily. 180 tablet 3   predniSONE (DELTASONE) 10 MG tablet Take 40 mg x 3 days, then 20 mg x 3 days, then 10 mg x 3 days 21 tablet 0   triamcinolone cream (KENALOG) 0.5 % Apply 1 Application topically 3 (three) times daily. 30 g 0   guaiFENesin (MUCINEX) 600 MG 12 hr tablet Take 2 tablets (1,200 mg total) by mouth 2 (two) times daily. (Patient not taking: Reported on 04/01/2022) 30 tablet 1   No current facility-administered medications for this visit.    Allergies: Patient has no known allergies.  Past Medical History:  Diagnosis Date   Anemia    Arthritis    hip, right knee    Fibroids    GERD (gastroesophageal reflux disease)    H/O menorrhagia    H/O sinusitis    Syncope    2017 due to dehydration     Past Surgical History:  Procedure Laterality Date   arthrsocopy knee     right knee in '04   BREAST EXCISIONAL BIOPSY Right    BREAST SURGERY  1985   reduction mammoplsty   COLONOSCOPY     G2P2     REDUCTION MAMMAPLASTY Bilateral 1985   TOTAL ABDOMINAL HYSTERECTOMY W/ BILATERAL SALPINGOOPHORECTOMY  07/06/07   WISDOM TOOTH EXTRACTION  1980    Family History  Problem Relation Age of  Onset   Diabetes Mother    Hypertension Mother    Stroke Mother    Hypertension Father    Diabetes Father    Stroke Father    Liver cancer Father    Diabetes Paternal Grandmother    Asthma Son    Breast cancer Sister    Colon cancer Neg Hx    Colon polyps Neg Hx    Esophageal cancer Neg Hx    Rectal cancer Neg Hx    Stomach cancer Neg Hx     Social History   Tobacco Use   Smoking status: Former   Smokeless tobacco: Never  Substance Use Topics   Alcohol use: No    Subjective:  Recurrent cough/ congestion- 3rd episode since early December; normal CXR done in early December; Did feel better for about 2 weeks after completing Augmentin/ Prednisone after Christmas;   Objective:  Vitals:   04/01/22 1049  BP:  132/74  Pulse: 80  Resp: 18  Temp: 98.3 F (36.8 C)  TempSrc: Oral  SpO2: 98%  Weight: 221 lb (100.2 kg)  Height: '5\' 2"'$  (1.575 m)    General: Well developed, well nourished, in no acute distress  Skin : Warm and dry.  Head: Normocephalic and atraumatic  Lungs: Respirations unlabored; clear to auscultation bilaterally without wheeze, rales, rhonchi  CVS exam: normal rate and regular rhythm.  Neurologic: Alert and oriented; speech intact; face symmetrical; moves all extremities well; CNII-XII intact without focal deficit  Assessment:  1. Wheezing   2. Recurrent cough     Plan:  Will change from Advair to [facility]; will treat with Z-pak and prednisone today; okay to continue nebulizer treatments as needed; work note for remainder of the week; refer to asthma/ allergy for further evaluation;   No follow-ups on file.  Orders Placed This Encounter  Procedures   Ambulatory referral to Allergy    Referral Priority:   Routine    Referral Type:   Allergy Testing    Referral Reason:   Specialty Services Required    Requested Specialty:   Allergy    Number of Visits Requested:   1    Requested Prescriptions   Signed Prescriptions Disp Refills   fluticasone furoate-vilanterol (BREO ELLIPTA) 200-25 MCG/ACT AEPB 28 each 1    Sig: Inhale 1 puff into the lungs daily.   azithromycin (ZITHROMAX Z-PAK) 250 MG tablet 6 tablet 0    Sig: Take 2 tablets (500 mg) PO today, then 1 tablet (250 mg) PO daily x4 days.   albuterol (PROVENTIL) (2.5 MG/3ML) 0.083% nebulizer solution 150 mL 1    Sig: Take 3 mLs (2.5 mg total) by nebulization every 6 (six) hours as needed for wheezing or shortness of breath.   predniSONE (DELTASONE) 10 MG tablet 21 tablet 0    Sig: Take 40 mg x 3 days, then 20 mg x 3 days, then 10 mg x 3 days

## 2022-04-02 ENCOUNTER — Telehealth: Payer: Self-pay | Admitting: Family

## 2022-04-02 NOTE — Telephone Encounter (Signed)
Patient wanted to make Heidi Stevens aware that she did get the inhaler and she is feeling better.

## 2022-04-06 ENCOUNTER — Emergency Department (HOSPITAL_COMMUNITY): Payer: BC Managed Care – PPO

## 2022-04-06 ENCOUNTER — Other Ambulatory Visit: Payer: Self-pay

## 2022-04-06 ENCOUNTER — Encounter (HOSPITAL_COMMUNITY): Payer: Self-pay

## 2022-04-06 ENCOUNTER — Emergency Department (HOSPITAL_COMMUNITY)
Admission: EM | Admit: 2022-04-06 | Discharge: 2022-04-06 | Disposition: A | Discharge status: Home / Self Care | Payer: BC Managed Care – PPO | Attending: Emergency Medicine | Admitting: Emergency Medicine

## 2022-04-06 DIAGNOSIS — Z1152 Encounter for screening for COVID-19: Secondary | ICD-10-CM | POA: Diagnosis not present

## 2022-04-06 DIAGNOSIS — R079 Chest pain, unspecified: Secondary | ICD-10-CM | POA: Insufficient documentation

## 2022-04-06 LAB — CBC WITH DIFFERENTIAL/PLATELET
Abs Immature Granulocytes: 0.1 10*3/uL — ABNORMAL HIGH (ref 0.00–0.07)
Basophils Absolute: 0.1 10*3/uL (ref 0.0–0.1)
Basophils Relative: 1 %
Eosinophils Absolute: 0.1 10*3/uL (ref 0.0–0.5)
Eosinophils Relative: 1 %
HCT: 43.8 % (ref 36.0–46.0)
Hemoglobin: 13.4 g/dL (ref 12.0–15.0)
Immature Granulocytes: 1 %
Lymphocytes Relative: 27 %
Lymphs Abs: 3.1 10*3/uL (ref 0.7–4.0)
MCH: 26.5 pg (ref 26.0–34.0)
MCHC: 30.6 g/dL (ref 30.0–36.0)
MCV: 86.7 fL (ref 80.0–100.0)
Monocytes Absolute: 0.8 10*3/uL (ref 0.1–1.0)
Monocytes Relative: 7 %
Neutro Abs: 7.3 10*3/uL (ref 1.7–7.7)
Neutrophils Relative %: 63 %
Platelets: 345 10*3/uL (ref 150–400)
RBC: 5.05 MIL/uL (ref 3.87–5.11)
RDW: 15.1 % (ref 11.5–15.5)
WBC: 11.4 10*3/uL — ABNORMAL HIGH (ref 4.0–10.5)
nRBC: 0 % (ref 0.0–0.2)

## 2022-04-06 LAB — TROPONIN I (HIGH SENSITIVITY): Troponin I (High Sensitivity): 5 ng/L (ref <18)

## 2022-04-06 LAB — COMPREHENSIVE METABOLIC PANEL
ALT: 15 U/L (ref 0–44)
AST: 17 U/L (ref 15–41)
Albumin: 3.6 g/dL (ref 3.5–5.0)
Alkaline Phosphatase: 69 U/L (ref 38–126)
Anion gap: 6 (ref 5–15)
BUN: 21 mg/dL (ref 8–23)
CO2: 28 mmol/L (ref 22–32)
Calcium: 9.1 mg/dL (ref 8.9–10.3)
Chloride: 102 mmol/L (ref 98–111)
Creatinine, Ser: 1.09 mg/dL — ABNORMAL HIGH (ref 0.44–1.00)
GFR, Estimated: 57 mL/min — ABNORMAL LOW (ref >60)
Glucose, Bld: 88 mg/dL (ref 70–99)
Potassium: 3 mmol/L — ABNORMAL LOW (ref 3.5–5.1)
Sodium: 136 mmol/L (ref 135–145)
Total Bilirubin: 0.4 mg/dL (ref 0.3–1.2)
Total Protein: 8.3 g/dL — ABNORMAL HIGH (ref 6.5–8.1)

## 2022-04-06 LAB — RESP PANEL BY RT-PCR (RSV, FLU A&B, COVID)  RVPGX2
Influenza A by PCR: NEGATIVE
Influenza B by PCR: NEGATIVE
Resp Syncytial Virus by PCR: NEGATIVE
SARS Coronavirus 2 by RT PCR: NEGATIVE

## 2022-04-06 MED ORDER — FAMOTIDINE 20 MG PO TABS
20.0000 mg | ORAL_TABLET | Freq: Once | ORAL | Status: AC
  Administered 2022-04-06: 20 mg via ORAL
  Filled 2022-04-06: qty 1

## 2022-04-06 MED ORDER — POTASSIUM CHLORIDE 20 MEQ PO PACK
20.0000 meq | PACK | Freq: Once | ORAL | Status: AC
  Administered 2022-04-06: 20 meq via ORAL
  Filled 2022-04-06: qty 1

## 2022-04-06 MED ORDER — ACETAMINOPHEN 500 MG PO TABS
1000.0000 mg | ORAL_TABLET | Freq: Once | ORAL | Status: AC
  Administered 2022-04-06: 1000 mg via ORAL
  Filled 2022-04-06: qty 2

## 2022-04-06 NOTE — ED Triage Notes (Signed)
Pt c/o CP for the last two days radiating to her back with SOB and nausea.

## 2022-04-06 NOTE — ED Provider Triage Note (Signed)
Emergency Medicine Provider Triage Evaluation Note  Heidi Stevens , a 67 y.o. female  was evaluated in triage.  Pt complains of chest pain, shortness of breath.  Patient states that symptoms began approximately 2 to 3 days ago.  She initially noted substernal chest pain with radiation to her back.  She stated since then, pain has been migrated to the left side of the chest without radiation.  Reports some associated shortness of breath.  States symptoms are worsened with physical activity.  Denies history of similar symptoms in the past..  Denies fever, cough, congestion, abdominal pain, vomiting  Review of Systems  Positive:  Negative: See above  Physical Exam  BP (!) 150/95   Pulse 77   Temp 98.9 F (37.2 C) (Oral)   Resp 18   SpO2 98%  Gen:   Awake, no distress   Resp:  Normal effort  MSK:   Moves extremities without difficulty  Other:    Medical Decision Making  Medically screening exam initiated at 2:01 PM.  Appropriate orders placed.  Heidi Stevens was informed that the remainder of the evaluation will be completed by another provider, this initial triage assessment does not replace that evaluation, and the importance of remaining in the ED until their evaluation is complete.     Heidi Stevens, [place] 04/06/22 1406

## 2022-04-06 NOTE — ED Provider Notes (Signed)
[place] [facility] Provider Note   CSN: [id number] Arrival date & time: 04/06/22  1345     History Chief Complaint  Patient presents with   Chest Pain    HPI Heidi Stevens is a 67 y.o. female presenting for chief complaint of chest pain.  He states that she is a 67 year old female with a minimal medical history does not smoke does not have a history of high blood pressure does not have diabetes.  No family history of heart disease.  She states that since Friday she has had intermittent left sharp chest pain intermittently.  She denies fevers or chills, nausea or vomiting, syncope or shortness of breath.  She otherwise ambulatory tolerating p.o. intake No known sick contacts.  She does endorse a cough and a history of asthma.  Patient's recorded medical, surgical, social, medication list and allergies were reviewed in the Snapshot window as part of the initial history.   Review of Systems   Review of Systems  Constitutional:  Negative for chills and fever.  HENT:  Negative for ear pain and sore throat.   Eyes:  Negative for pain and visual disturbance.  Respiratory:  Negative for cough and shortness of breath.   Cardiovascular:  Positive for chest pain. Negative for palpitations.  Gastrointestinal:  Negative for abdominal pain and vomiting.  Genitourinary:  Negative for dysuria and hematuria.  Musculoskeletal:  Negative for arthralgias and back pain.  Skin:  Negative for color change and rash.  Neurological:  Negative for seizures and syncope.  All other systems reviewed and are negative.   Physical Exam Updated Vital Signs BP 138/79   Pulse 67   Temp 98.9 F (37.2 C) (Oral)   Resp 16   Ht '5\' 2"'$  (1.575 m)   Wt 100 kg   SpO2 97%   BMI 40.32 kg/m  Physical Exam Vitals and nursing note reviewed.  Constitutional:      General: She is not in acute distress.    Appearance: She is well-developed.  HENT:     Head: Normocephalic  and atraumatic.  Eyes:     Conjunctiva/sclera: Conjunctivae normal.  Cardiovascular:     Rate and Rhythm: Normal rate and regular rhythm.     Heart sounds: No murmur heard. Pulmonary:     Effort: Pulmonary effort is normal. No respiratory distress.     Breath sounds: Normal breath sounds.  Abdominal:     Palpations: Abdomen is soft.     Tenderness: There is no abdominal tenderness.  Musculoskeletal:        General: No swelling.     Cervical back: Neck supple.  Skin:    General: Skin is warm and dry.     Capillary Refill: Capillary refill takes less than 2 seconds.  Neurological:     Mental Status: She is alert.  Psychiatric:        Mood and Affect: Mood normal.      ED Course/ Medical Decision Making/ A&P    Procedures Procedures   Medications Ordered in ED Medications  acetaminophen (TYLENOL) tablet 1,000 mg (1,000 mg Oral Given 04/06/22 1523)  famotidine (PEPCID) tablet 20 mg (20 mg Oral Given 04/06/22 1523)  potassium chloride (KLOR-CON) packet 20 mEq (20 mEq Oral Given 04/06/22 1523)   Medical Decision Making: Heidi Stevens is a 67 y.o. female who presented to the ED today with chest pain, detailed above.  Based on patient's comorbidities, patient has a heart score of 1.  Patient's presentation is complicated by their history of multiple comorbid medical problems.  Patient placed on continuous vitals and telemetry monitoring while in ED which was reviewed periodically.  Complete initial physical exam performed, notably the patient was HDS in NAD.   Reviewed and confirmed nursing documentation for past medical history, family history, social history.    Initial Assessment: With the patient's presentation of left-sided chest pain, most likely diagnosis is musculoskeletal chest pain versus GERD, although ACS remains on the differential. Other diagnoses were considered including (but not limited to) pulmonary embolism, community-acquired pneumonia, aortic dissection,  pneumothorax, underlying bony abnormality, anemia. These are considered less likely due to history of present illness and physical exam findings.    In particular, concerning pulmonary embolism: Patient is without malignancy, recent surgery, history of DVT, or calf tenderness leading to a low risk Wells score. Aortic Dissection also reconsidered but seems less likely based on the location, quality, onset, and severity of symptoms in this case. Patient has a lack of serious comorbidities for this condition including a lack of HTN or Smoking. Patient also has a lack of underlying history of AD or TAA.  This is most consistent with an acute life/limb threatening illness complicated by underlying chronic conditions.   Initial Plan: Evaluate for ACS with single troponin and EKG evaluated as below  Evaluate for dissection, bony abnormality, or pneumonia with chest x-ray and screening laboratory evaluation including CBC, BMP  Further evaluation for pulmonary embolism not indicated at this time based on patient's Wells score.  Further evaluation for Thoracic Aortic Dissection not indicated at this time based on patient's clinical history and PE findings.   Initial Study Results: EKG was reviewed independently. Rate, rhythm, axis, intervals all examined and without medically relevant abnormality. ST segments without concerns for elevations.    Laboratory  Single troponin demonstrated NAA   CBC and BMP without obvious metabolic or inflammatory abnormalities requiring further evaluation   Radiology  DG Chest 2 View  Result Date: 04/06/2022 CLINICAL DATA:  Left-sided chest pain EXAM: CHEST - 2 VIEW COMPARISON:  Chest radiograph 02/17/2022 FINDINGS: The heart size and mediastinal contours are within normal limits. Both lungs are clear. The visualized skeletal structures are unremarkable. IMPRESSION: No active cardiopulmonary disease. Electronically Signed   By: Lovey Newcomer M.D.   On: 04/06/2022 14:27     Final Assessment and Plan: Patient's history of present illness and physical exam findings in the context of the objective findings are most consistent with nonspecific etiology.  Favor gastroesophageal versus musculoskeletal.  Will treat in the emergency room while waiting for viral screening and plan for reassessment. Reassessed after 3 hours of observation.  Symptoms completely resolved at this time.  Had a prolonged conversation with patient.  Given resolution of symptoms, low heart score, patient stable for outpatient follow-up with PCP and referral to cardiology if indicated per PCP.  No acute indication for further intervention in the emergency department patient discharged in no acute distress.  Disposition:  I have considered need for hospitalization, however, considering all of the above, I believe this patient is stable for discharge at this time.  Patient/family educated about specific return precautions for given chief complaint and symptoms.  Patient/family educated about follow-up with PCP.     Patient/family expressed understanding of return precautions and need for follow-up. Patient spoken to regarding all imaging and laboratory results and appropriate follow up for these results. All education provided in verbal form with additional information in written form. Time was  allowed for answering of patient questions. Patient discharged.    Emergency Department Medication Summary:   Medications  acetaminophen (TYLENOL) tablet 1,000 mg (1,000 mg Oral Given 04/06/22 1523)  famotidine (PEPCID) tablet 20 mg (20 mg Oral Given 04/06/22 1523)  potassium chloride (KLOR-CON) packet 20 mEq (20 mEq Oral Given 04/06/22 1523)        Clinical Impression:  1. Chest pain, unspecified type      Discharge   Final Clinical Impression(s) / ED Diagnoses Final diagnoses:  Chest pain, unspecified type    Rx / DC Orders ED Discharge Orders     None         Tretha Sciara,  MD 04/06/22 [phone]

## 2022-05-28 ENCOUNTER — Other Ambulatory Visit: Payer: Self-pay | Admitting: Family

## 2022-06-02 ENCOUNTER — Other Ambulatory Visit: Payer: Self-pay | Admitting: Family

## 2022-06-19 ENCOUNTER — Ambulatory Visit
Admission: EM | Admit: 2022-06-19 | Discharge: 2022-06-19 | Disposition: A | Discharge status: Home / Self Care | Payer: BC Managed Care – PPO | Attending: Internal Medicine | Admitting: Internal Medicine

## 2022-06-19 DIAGNOSIS — R0981 Nasal congestion: Secondary | ICD-10-CM | POA: Insufficient documentation

## 2022-06-19 DIAGNOSIS — J111 Influenza due to unidentified influenza virus with other respiratory manifestations: Secondary | ICD-10-CM | POA: Diagnosis not present

## 2022-06-19 DIAGNOSIS — R112 Nausea with vomiting, unspecified: Secondary | ICD-10-CM | POA: Insufficient documentation

## 2022-06-19 DIAGNOSIS — Z1152 Encounter for screening for COVID-19: Secondary | ICD-10-CM | POA: Diagnosis not present

## 2022-06-19 DIAGNOSIS — R059 Cough, unspecified: Secondary | ICD-10-CM | POA: Insufficient documentation

## 2022-06-19 DIAGNOSIS — R519 Headache, unspecified: Secondary | ICD-10-CM | POA: Insufficient documentation

## 2022-06-19 DIAGNOSIS — Z87891 Personal history of nicotine dependence: Secondary | ICD-10-CM | POA: Insufficient documentation

## 2022-06-19 DIAGNOSIS — J4520 Mild intermittent asthma, uncomplicated: Secondary | ICD-10-CM | POA: Diagnosis not present

## 2022-06-19 MED ORDER — OSELTAMIVIR PHOSPHATE 75 MG PO CAPS: 75.0000 mg | ORAL_CAPSULE | Freq: Two times a day (BID) | ORAL | 0 refills | Status: DC

## 2022-06-19 MED ORDER — ONDANSETRON 4 MG PO TBDP
4.0000 mg | ORAL_TABLET | Freq: Once | ORAL | Status: AC
  Administered 2022-06-19: 4 mg via ORAL

## 2022-06-19 MED ORDER — ONDANSETRON 4 MG PO TBDP: 4.0000 mg | ORAL_TABLET | Freq: Three times a day (TID) | ORAL | 0 refills | Status: DC | PRN

## 2022-06-19 MED ORDER — ACETAMINOPHEN 325 MG PO TABS
975.0000 mg | ORAL_TABLET | Freq: Once | ORAL | Status: AC
  Administered 2022-06-19: 975 mg via ORAL

## 2022-06-19 MED ORDER — KETOROLAC TROMETHAMINE 30 MG/ML IJ SOLN
15.0000 mg | Freq: Once | INTRAMUSCULAR | Status: AC
  Administered 2022-06-19: 15 mg via INTRAMUSCULAR

## 2022-06-19 MED ORDER — BENZONATATE 100 MG PO CAPS: 100.0000 mg | ORAL_CAPSULE | Freq: Three times a day (TID) | ORAL | 0 refills | Status: DC

## 2022-06-19 NOTE — ED Provider Notes (Signed)
[facility]    CSN: [id number] Arrival date & time: 06/19/22  1827      History   Chief Complaint Chief Complaint  Patient presents with   Headache    HPI Heidi Stevens is a 67 y.o. female.   Patient presents to urgent care for evaluation of cough, nasal congestion, nausea, vomiting, and generalized headache that started today. Cough is dry and non-productive. She has had multiple episodes of non-bloody/non-bilious emesis today. No abdominal pain, dizziness, shortness of breath, chest pain, heart palpitations, vision changes, syncope, paresthesias, extremity weakness, or recent falls reported. No diarrhea or blood/mucous to the stools. History of asthma, denies recent use of inhaler.  Denies shortness of breath, chest pain, heart palpitations, and dizziness.  No recent antibiotic or steroid use.  Non-smoker, denies drug use.  No recent antipyretic.  Currently febrile at 101.2.  Has been using over-the-counter medications to help with symptoms before coming to urgent care.   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/05/2014   Primary localized osteoarthrosis, lower leg 07/11/2013   IT band syndrome 07/11/2013   Obesity, Class II, BMI 35-39.9 05/31/2013   Acute meniscal tear of knee, subsequent encounter 04/27/2013   Anemia    Fibroids    H/O sinusitis    GERD (gastroesophageal reflux disease) 10/24/2010   Healthcare maintenance 10/24/2010   ALLERGIC RHINITIS CAUSE UNSPECIFIED 07/03/2009     Past Surgical History:  Procedure Laterality Date   arthrsocopy knee     right knee in '04   BREAST EXCISIONAL BIOPSY Right    BREAST SURGERY  1985   reduction mammoplsty   COLONOSCOPY     G2P2     REDUCTION MAMMAPLASTY Bilateral 1985   TOTAL ABDOMINAL HYSTERECTOMY W/ BILATERAL SALPINGOOPHORECTOMY  07/06/07   WISDOM TOOTH EXTRACTION  1980    OB History     Gravida  2   Para  2   Term      Preterm      AB      Living  2      SAB      IAB      Ectopic      Multiple      Live Births               Home Medications    Prior to Admission medications   Medication Sig Start Date End Date Taking? Authorizing Provider  benzonatate (TESSALON) 100 MG capsule Take 1 capsule (100 mg total) by mouth every 8 (eight) hours. 06/19/22  Yes Carlisle Beers, FNP  ondansetron (ZOFRAN-ODT) 4 MG disintegrating tablet Take 1 tablet (4 mg total) by mouth every 8 (eight) hours as needed for nausea or vomiting. 06/19/22  Yes Carlisle Beers, FNP  oseltamivir (TAMIFLU) 75 MG capsule Take 1 capsule (75 mg total) by mouth every 12 (twelve) hours. 06/19/22  Yes Carlisle Beers, FNP  albuterol (PROVENTIL) (2.5 MG/3ML) 0.083% nebulizer  solution Take 3 mLs (2.5 mg total) by nebulization every 6 (six) hours as needed for wheezing or shortness of breath. 04/01/22   Olive Bass, FNP  albuterol (VENTOLIN HFA) 108 (90 Base) MCG/ACT inhaler Inhale 2 puffs into the lungs every 6 (six) hours as needed for wheezing or shortness of breath. 03/12/22   Clayborne Dana, NP  azithromycin (ZITHROMAX Z-PAK) 250 MG tablet Take 2 tablets (500 mg) PO today, then 1 tablet (250 mg) PO daily x4 days. 04/01/22   Olive Bass, FNP  BREO ELLIPTA [phone] MCG/ACT AEPB TAKE 1 PUFF BY MOUTH EVERY DAY 05/28/22   Olive Bass, FNP  CALCIUM PO Take by mouth.    [provider]  cholecalciferol (VITAMIN D3) 25 MCG (1000 UNIT) tablet Take 1,000 Units by mouth daily.     [provider]  fluticasone (FLONASE) 50 MCG/ACT nasal spray Place 2 sprays into both nostrils daily. 03/12/22   Clayborne Dana, NP  fluticasone-salmeterol (ADVAIR) 250-50 MCG/ACT AEPB Inhale 1 puff into the lungs in the morning and at bedtime. 03/24/22   Olive Bass, FNP  guaiFENesin (MUCINEX) 600 MG 12 hr tablet Take 2 tablets (1,200 mg total) by mouth 2 (two) times daily. Patient not taking: Reported on 04/01/2022 03/12/22   Hyman Hopes B, NP  meloxicam (MOBIC) 15 MG tablet TAKE 1 TABLET (15 MG TOTAL) BY MOUTH DAILY. 06/02/22   Olive Bass, FNP  ondansetron (ZOFRAN) 4 MG tablet Take 1 tablet (4 mg total) by mouth every 8 (eight) hours as needed. 08/23/20   Hyatt, Max T, DPM  pantoprazole (PROTONIX) 40 MG tablet Take 1 tablet (40 mg total) by mouth 2 (two) times daily. 02/14/22   Olive Bass, FNP  predniSONE (DELTASONE) 10 MG tablet Take 40 mg x 3 days, then 20 mg x 3 days, then 10 mg x 3 days 04/01/22   Olive Bass, FNP  triamcinolone cream (KENALOG) 0.5 % Apply 1 Application topically 3 (three) times daily. 12/26/21   Ardith Dark, MD    Family History Family History  Problem Relation Age of Onset   Diabetes Mother    Hypertension Mother    Stroke Mother    Hypertension Father    Diabetes Father    Stroke Father    Liver cancer Father    Diabetes Paternal Grandmother    Asthma Son    Breast cancer Sister    Colon cancer Neg Hx    Colon polyps Neg Hx    Esophageal cancer Neg Hx    Rectal cancer Neg Hx    Stomach cancer Neg Hx     Social History Social History   Tobacco Use   Smoking status: Former   Smokeless tobacco: Never  Substance Use Topics   Alcohol use: No   Drug use: No     Allergies   Patient has no known allergies.   Review of Systems Review of Systems  Neurological:  Positive for headaches.  Per HPI   Physical Exam Triage Vital Signs ED Triage Vitals  Enc Vitals Group     BP 06/19/22 1928 128/70      Pulse Rate 06/19/22 1928 93     Resp 06/19/22 1928 16     Temp 06/19/22 1928 (!) 101.2 F (38.4 C)     Temp Source 06/19/22 1928 Oral     SpO2 --      Weight --      Height --  Head Circumference --      Peak Flow --      Pain Score 06/19/22 1946 8     Pain Loc --      Pain Edu? --      Excl. in GC? --    No data found.  Updated Vital Signs BP 128/70 (BP Location: Left Arm)   Pulse 93   Temp (!) 101.2 F (38.4 C) (Oral)   Resp 16   Visual Acuity Right Eye Distance:   Left Eye Distance:   Bilateral Distance:    Right Eye Near:   Left Eye Near:    Bilateral Near:     Physical Exam Vitals and nursing note reviewed.  Constitutional:      Appearance: She is ill-appearing. She is not toxic-appearing.  HENT:     Head: Normocephalic and atraumatic.     Right Ear: Hearing, tympanic membrane, ear canal and external ear normal.     Left Ear: Hearing, tympanic membrane, ear canal and external ear normal.     Nose: Congestion present.     Mouth/Throat:     Lips: Pink.     Mouth: Mucous membranes are moist. No injury.     Tongue: No lesions. Tongue does not deviate from midline.     Palate: No mass and lesions.     Pharynx: Oropharynx is clear. Uvula midline. Posterior oropharyngeal erythema present. No pharyngeal swelling, oropharyngeal exudate or uvula swelling.     Tonsils: No tonsillar exudate or tonsillar abscesses.  Eyes:     General: Lids are normal. Vision grossly intact. Gaze aligned appropriately.     Extraocular Movements: Extraocular movements intact.     Conjunctiva/sclera: Conjunctivae normal.  Cardiovascular:     Rate and Rhythm: Normal rate and regular rhythm.     Heart sounds: Normal heart sounds, S1 normal and S2 normal.  Pulmonary:     Effort: Pulmonary effort is normal. No respiratory distress.     Breath sounds: Normal breath sounds and air entry. No stridor. No wheezing, rhonchi or rales.  Chest:     Chest wall: No tenderness.  Abdominal:      General: Bowel sounds are normal.     Palpations: Abdomen is soft.     Tenderness: There is no abdominal tenderness. There is no right CVA tenderness, left CVA tenderness or guarding.  Musculoskeletal:     Cervical back: Neck supple. No tenderness.  Lymphadenopathy:     Cervical: Cervical adenopathy present.  Skin:    General: Skin is warm and dry.     Capillary Refill: Capillary refill takes less than 2 seconds.     Findings: No rash.  Neurological:     General: No focal deficit present.     Mental Status: She is alert and oriented to person, place, and time. Mental status is at baseline.     Cranial Nerves: Cranial nerves 2-12 are intact. No dysarthria or facial asymmetry.     Sensory: Sensation is intact.     Motor: Motor function is intact.     Coordination: Coordination is intact.     Gait: Gait is intact.  Psychiatric:        Mood and Affect: Mood normal.        Speech: Speech normal.        Behavior: Behavior normal.        Thought Content: Thought content normal.        Judgment: Judgment normal.      UC  Treatments / Results  Labs (all labs ordered are listed, but only abnormal results are displayed) Labs Reviewed  SARS CORONAVIRUS 2 (TAT 6-24 HRS)    EKG   Radiology No results found.  Procedures Procedures (including critical care time)  Medications Ordered in UC Medications  ondansetron (ZOFRAN-ODT) disintegrating tablet 4 mg (4 mg Oral Given 06/19/22 1951)  ketorolac (TORADOL) 30 MG/ML injection 15 mg (15 mg Intramuscular Given 06/19/22 1950)  acetaminophen (TYLENOL) tablet 975 mg (975 mg Oral Given 06/19/22 1950)    Initial Impression / Assessment and Plan / UC Course  I have reviewed the triage vital signs and the nursing notes.  Pertinent labs & imaging results that were available during my care of the patient were reviewed by me and considered in my medical decision making (see chart for details).   1.  Influenza-like illness Presentation is  consistent with acute viral upper respiratory tract infection that is consistent with influenza in nature.  I would like to go ahead and start treatment for influenza with Tamiflu twice daily for the next 5 days as she is in the treatment window to start antiviral therapy.  COVID-19 testing is pending and will come back in the next 12 to 24 hours.  I will call patient if COVID-19 testing is positive and have her stop taking Tamiflu/start taking COVID 19 antiviral.  Currently nontoxic in appearance with hemodynamically stable vital signs, therefore deferred imaging of the chest.  History of asthma, no signs of asthma exacerbation currently.  May continue using albuterol inhaler as needed for cough, shortness of breath, and wheeze.  No current indication for referral to the emergency department for further workup or evaluation, however strict ER return precautions have been discussed.  Otherwise, we will provide symptomatic relief with Zofran, and Tessalon Perles every 8 hours as needed for nausea/vomiting and cough respectively.  Patient given ketorolac 30 mg IM, Tylenol 975, and Zofran 4 mg ODT in clinic for generalized headache, fever/chills, and nausea.  May use Tylenol/ibuprofen as needed for body aches and fever/chills.  No NSAIDs for 24 hours after ketorolac injection in clinic.  May use guaifenesin every 12 hours as needed for nasal congestion.   Discussed physical exam and available lab work findings in clinic with patient.  Counseled patient regarding appropriate use of medications and potential side effects for all medications recommended or prescribed today. Discussed red flag signs and symptoms of worsening condition,when to call the PCP office, return to urgent care, and when to seek higher level of care in the emergency department. Patient verbalizes understanding and agreement with plan. All questions answered. Patient discharged in stable condition.     Final Clinical Impressions(s) / UC  Diagnoses   Final diagnoses:  Influenza-like illness  Mild intermittent asthma without complication     Discharge Instructions      Your symptoms are most likely due to a viral illness, which will improve on its own with rest and fluids. COVID-19 testing is pending and will come back in the next 12-24 hours. I will call you if your COVID test is positive and have you stop taking tamiflu.  Take tamiflu twice a day for the next 5 days as I suspect you may have influenza.  We will treat your symptoms with the following: - Zofran 4mg  every 8 hours as needed to help with nausea and vomiting.  Eat a bland diet for the next 12-24 hours (bananas, rice, white toast, and applesauce) once you are able to tolerate  liquids (broth, etc). These foods are easy for your stomach to digest. Pedialyte can be purchased to help with rehydration. Drink plenty of water.  - Ibuprofen 600mg  and/or tylenol 1,000mg  every 6 hours as needed for fever/chills and aches/pains. - Guaifenesin (Mucinex) may be purchased over the counter to be taken as directed to reduce nasal congestion/cough - Tessalon perles every 8 hours as needed for coughing  - Two teaspoons of honey (10 ml) can be given by mouth or in warm water every four hours as needed and may decrease the amount of coughing. - Use a cool mist humidifier in your room at night  If you develop any new or worsening symptoms or do not improve in the next 2 to 3 days, please return.  If your symptoms are severe, please go to the emergency room.  Follow-up with your primary care provider for further evaluation and management of your symptoms as well as ongoing wellness visits.  I hope you feel better!       ED Prescriptions     Medication Sig Dispense Auth. Provider   ondansetron (ZOFRAN-ODT) 4 MG disintegrating tablet Take 1 tablet (4 mg total) by mouth every 8 (eight) hours as needed for nausea or vomiting. 20 tablet Reita May M, FNP   benzonatate  (TESSALON) 100 MG capsule Take 1 capsule (100 mg total) by mouth every 8 (eight) hours. 21 capsule Carlisle Beers, FNP   oseltamivir (TAMIFLU) 75 MG capsule Take 1 capsule (75 mg total) by mouth every 12 (twelve) hours. 10 capsule Carlisle Beers, FNP      PDMP not reviewed this encounter.   Carlisle Beers, [place] 06/22/22 2159

## 2022-06-19 NOTE — Discharge Instructions (Addendum)
Your symptoms are most likely due to a viral illness, which will improve on its own with rest and fluids. COVID-19 testing is pending and will come back in the next 12-24 hours. I will call you if your COVID test is positive and have you stop taking tamiflu.  Take tamiflu twice a day for the next 5 days as I suspect you may have influenza.  We will treat your symptoms with the following: - Zofran 4mg  every 8 hours as needed to help with nausea and vomiting.  Eat a bland diet for the next 12-24 hours (bananas, rice, white toast, and applesauce) once you are able to tolerate liquids (broth, etc). These foods are easy for your stomach to digest. Pedialyte can be purchased to help with rehydration. Drink plenty of water.  - Ibuprofen 600mg  and/or tylenol 1,000mg  every 6 hours as needed for fever/chills and aches/pains. - Guaifenesin (Mucinex) may be purchased over the counter to be taken as directed to reduce nasal congestion/cough - Tessalon perles every 8 hours as needed for coughing  - Two teaspoons of honey (10 ml) can be given by mouth or in warm water every four hours as needed and may decrease the amount of coughing. - Use a cool mist humidifier in your room at night  If you develop any new or worsening symptoms or do not improve in the next 2 to 3 days, please return.  If your symptoms are severe, please go to the emergency room.  Follow-up with your primary care provider for further evaluation and management of your symptoms as well as ongoing wellness visits.  I hope you feel better!

## 2022-06-19 NOTE — ED Triage Notes (Signed)
Pt states headache and vomiting today.

## 2022-06-20 ENCOUNTER — Ambulatory Visit: Payer: BC Managed Care – PPO | Admitting: Internal Medicine

## 2022-06-20 LAB — SARS CORONAVIRUS 2 (TAT 6-24 HRS): SARS Coronavirus 2: NEGATIVE

## 2022-07-22 ENCOUNTER — Other Ambulatory Visit: Payer: Self-pay | Admitting: Family

## 2022-07-27 ENCOUNTER — Other Ambulatory Visit: Payer: Self-pay | Admitting: Family

## 2022-08-22 ENCOUNTER — Encounter: Payer: Self-pay | Admitting: Internal Medicine

## 2022-08-22 ENCOUNTER — Ambulatory Visit: Payer: BC Managed Care – PPO | Admitting: Internal Medicine

## 2022-08-22 ENCOUNTER — Other Ambulatory Visit: Payer: Self-pay

## 2022-08-22 VITALS — BP 136/84 | HR 71 | Temp 98.0°F | Resp 20 | Ht 62.0 in | Wt 226.3 lb

## 2022-08-22 DIAGNOSIS — J4530 Mild persistent asthma, uncomplicated: Secondary | ICD-10-CM | POA: Diagnosis not present

## 2022-08-22 DIAGNOSIS — J3089 Other allergic rhinitis: Secondary | ICD-10-CM | POA: Diagnosis not present

## 2022-08-22 DIAGNOSIS — J301 Allergic rhinitis due to pollen: Secondary | ICD-10-CM

## 2022-08-22 DIAGNOSIS — J3081 Allergic rhinitis due to animal (cat) (dog) hair and dander: Secondary | ICD-10-CM | POA: Diagnosis not present

## 2022-08-22 DIAGNOSIS — K219 Gastro-esophageal reflux disease without esophagitis: Secondary | ICD-10-CM

## 2022-08-22 DIAGNOSIS — T781XXA Other adverse food reactions, not elsewhere classified, initial encounter: Secondary | ICD-10-CM

## 2022-08-22 MED ORDER — ALBUTEROL SULFATE HFA 108 (90 BASE) MCG/ACT IN AERS: 2.0000 | INHALATION_SPRAY | Freq: Four times a day (QID) | RESPIRATORY_TRACT | 1 refills | Status: DC | PRN

## 2022-08-22 MED ORDER — CETIRIZINE HCL 10 MG PO TABS: 10.0000 mg | ORAL_TABLET | Freq: Every day | ORAL | 5 refills | Status: DC

## 2022-08-22 MED ORDER — FLUTICASONE PROPIONATE 50 MCG/ACT NA SUSP: 2.0000 | Freq: Every day | NASAL | 6 refills | Status: DC

## 2022-08-22 MED ORDER — ALBUTEROL SULFATE (2.5 MG/3ML) 0.083% IN NEBU: 2.5000 mg | INHALATION_SOLUTION | Freq: Four times a day (QID) | RESPIRATORY_TRACT | 1 refills | Status: DC | PRN

## 2022-08-22 MED ORDER — FLUTICASONE FUROATE-VILANTEROL 200-25 MCG/ACT IN AEPB: 1.0000 | INHALATION_SPRAY | Freq: Every day | RESPIRATORY_TRACT | 5 refills | Status: DC

## 2022-08-22 NOTE — Patient Instructions (Addendum)
Mild Persistent asthma  - Reduce second hand smoke exposure.  Husband is already working on smoking cessation! - Spirometry today was normal.  - Maintenance inhaler: continue Breo 200-17mcg 1 puff daily.  - Rescue inhaler: Albuterol 2 puffs via spacer or 1 vial via nebulizer every 4-6 hours as needed for respiratory symptoms of cough, shortness of breath, or wheezing Asthma control goals:  Full participation in all desired activities (may need albuterol before activity) Albuterol use two times or less a week on average (not counting use with activity) Cough interfering with sleep two times or less a month Oral steroids no more than once a year No hospitalizations   Allergic Rhinitis: - Positive skin test 08/2022: grasses, molds, dust mite, horses  - Avoidance measures discussed. - Use nasal saline rinses before nose sprays such as with Neilmed Sinus Rinse.  Use distilled water.   - Use Flonase 2 sprays each nostril daily. Aim upward and outward. - Use Zyrtec 10 mg daily.  - Consider allergy shots as long term control of your symptoms by teaching your immune system to be more tolerant of your allergy triggers.   GERD - On Protonix 20mg  twice daily.  Try to wean to 20mg  daily if symptoms are controlled.  -Avoid lying down for at least two hours after a meal or after drinking acidic beverages, like soda, or other caffeinated beverages. This can help to prevent stomach contents from flowing back into the esophagus. -Keep your head elevated while you sleep. Using an extra pillow or two can also help to prevent reflux. -Eat smaller and more frequent meals each day instead of a few large meals. This promotes digestion and can aid in preventing heartburn. -Wear loose-fitting clothes to ease pressure on the stomach, which can worsen heartburn and reflux. -Reduce excess weight around the midsection. This can ease pressure on the stomach. Such pressure can force some stomach contents back up the  esophagus.   Pruritus with Strawberry - Negative skin testing to strawberry so do not think this is a food allergy. Likely an intolerance.     ALLERGEN AVOIDANCE MEASURES   Dust Mites Use central air conditioning and heat; and change the filter monthly.  Pleated filters work better than mesh filters.  Electrostatic filters may also be used; wash the filter monthly.  Window air conditioners may be used, but do not clean the air as well as a central air conditioner.  Change or wash the filter monthly. Keep windows closed.  Do not use attic fans.   Encase the mattress, box springs and pillows with zippered, dust proof covers. Wash the bed linens in hot water weekly.   Remove carpet, especially from the bedroom. Remove stuffed animals, throw pillows, dust ruffles, heavy drapes and other items that collect dust from the bedroom. Do not use a humidifier.   Use wood, vinyl or leather furniture instead of cloth furniture in the bedroom. Keep the indoor humidity at 30 - 40%.  Monitor with a humidity gauge.  Molds - Indoor avoidance Use air conditioning to reduce indoor humidity.  Do not use a humidifier. Keep indoor humidity at 30 - 40%.  Use a dehumidifier if needed. In the bathroom use an exhaust fan or open a window after showering.  Wipe down damp surfaces after showering.  Clean bathrooms with a mold-killing solution (diluted bleach, or products like Tilex, etc) at least once a month. In the kitchen use an exhaust fan to remove steam from cooking.  Throw away spoiled  foods immediately, and empty garbage daily.  Empty water pans below self-defrosting refrigerators frequently. Vent the clothes dryer to the outside. Limit indoor houseplants; mold grows in the dirt.  No houseplants in the bedroom. Remove carpet from the bedroom. Encase the mattress and box springs with a zippered encasing.  Molds - Outdoor avoidance Avoid being outside when the grass is being mowed, or the ground is  tilled. Avoid playing in leaves, pine straw, hay, etc.  Dead plant materials contain mold. Avoid going into barns or grain storage areas. Remove leaves, clippings and compost from around the home.  Pollen Avoidance Pollen levels are highest during the mid-day and afternoon.  Consider this when planning outdoor activities. Avoid being outside when the grass is being mowed, or wear a mask if the pollen-allergic person must be the one to mow the grass. Keep the windows closed to keep pollen outside of the home. Use an air conditioner to filter the air. Take a shower, wash hair, and change clothing after working or playing outdoors during pollen season.

## 2022-08-22 NOTE — Progress Notes (Signed)
NEW PATIENT  Date of Service/Encounter:  08/22/22  Consult requested by: Olive Bass, FNP   Subjective:   Heidi Stevens (DOB: December 15, 1955) is a 67 y.o. female who presents to the clinic on 08/22/2022 with a chief complaint of Allergy Testing (Environmental: ALL/Food: No Hx), Asthma, Eczema, and Gastroesophageal Reflux .    History obtained from: chart review and patient.  Asthma:  Diagnosed at age 20.  Reports onset of symptoms around Dec 2023 when she got sick and had SOB and heavy breathing.   Then in Feb 2024, again got sick and had SOB/chest tightness and wheezing.   Also reports having intermittent nighttime wheezing and frequent dry cough.  She has been taking Breo daily and reports this is helping control with her symptoms and she rarely requires albuterol.  She did have to use albuterol this week specifically this morning after having to walk to get to a graduation.   Twice  daytime symptoms in past month, few nighttime awakenings in past month Using rescue inhaler: about twice this month Limitations to daily activity: mild 2 ED visits/UC visits and 2 oral steroids in the past year 0 number of lifetime hospitalizations, 0 number of lifetime intubations.  Identified Triggers: respiratory illness and cold air Prior PFTs or spirometry: none Previously used therapies: Advair  Current regimen:  Maintenance: Breo 200-78mcg 1 puff daily Rescue: Albuterol 2 puffs q4-6 hrs PRN  Rhinitis:  Started around age 80-30s but worse this year.  Symptoms include: nasal congestion, rhinorrhea, post nasal drainage, and sneezing  Occurs year-round with seasonal flares in Fall/Winter Potential triggers: not sure  Treatments tried:  Zyrtec daily; last use was Monday Flonase PRN  Previous allergy testing: no History of sinus surgery: no Nonallergic triggers: none    GERD: Reports a hx of reflux.  Has uptitrated to Protonix 20mg  BID.  Reports control of heartburn and  reflux with this dose. Denies any sour taste in mouth.   Food Allergies: Reports mouth itching with strawberries. No other symptoms.    Past Medical History: Past Medical History:  Diagnosis Date   Anemia    Arthritis    hip, right knee    Fibroids    GERD (gastroesophageal reflux disease)    H/O menorrhagia    H/O sinusitis    Syncope    2017 due to dehydration    Past Surgical History: Past Surgical History:  Procedure Laterality Date   arthrsocopy knee     right knee in '04   BREAST EXCISIONAL BIOPSY Right    BREAST SURGERY  1985   reduction mammoplsty   COLONOSCOPY     G2P2     REDUCTION MAMMAPLASTY Bilateral 1985   TOTAL ABDOMINAL HYSTERECTOMY W/ BILATERAL SALPINGOOPHORECTOMY  07/06/07   WISDOM TOOTH EXTRACTION  1980    Family History: Family History  Problem Relation Age of Onset   Diabetes Mother    Hypertension Mother    Stroke Mother    Hypertension Father    Diabetes Father    Stroke Father    Liver cancer Father    Diabetes Paternal Grandmother    Asthma Son    Breast cancer Sister    Colon cancer Neg Hx    Colon polyps Neg Hx    Esophageal cancer Neg Hx    Rectal cancer Neg Hx    Stomach cancer Neg Hx     Social History:  Lives in a 60 year house Flooring in bedroom:  concrete Pets: dog Tobacco  use/exposure: second hand smoke exposure through husband Job: school nutrition  Medication List:  Allergies as of 08/22/2022   No Known Allergies      Medication List        Accurate as of August 22, 2022  4:18 PM. If you have any questions, ask your nurse or doctor.          STOP taking these medications    azithromycin 250 MG tablet Commonly known as: Zithromax Z-Pak Stopped by: Birder Robson, MD   benzonatate 100 MG capsule Commonly known as: TESSALON Stopped by: Birder Robson, MD   fluticasone-salmeterol 250-50 MCG/ACT Aepb Commonly known as: ADVAIR Stopped by: Birder Robson, MD   ondansetron 4 MG disintegrating  tablet Commonly known as: ZOFRAN-ODT Stopped by: Birder Robson, MD   ondansetron 4 MG tablet Commonly known as: Zofran Stopped by: Birder Robson, MD   oseltamivir 75 MG capsule Commonly known as: TAMIFLU Stopped by: Birder Robson, MD   predniSONE 10 MG tablet Commonly known as: DELTASONE Stopped by: Birder Robson, MD       TAKE these medications    albuterol 108 (90 Base) MCG/ACT inhaler Commonly known as: VENTOLIN HFA Inhale 2 puffs into the lungs every 6 (six) hours as needed for wheezing or shortness of breath.   albuterol (2.5 MG/3ML) 0.083% nebulizer solution Commonly known as: PROVENTIL Take 3 mLs (2.5 mg total) by nebulization every 6 (six) hours as needed for wheezing or shortness of breath.   Breo Ellipta 200-25 MCG/ACT Aepb Generic drug: fluticasone furoate-vilanterol INHALE 1 PUFF BY MOUTH EVERY DAY   CALCIUM PO Take by mouth.   cholecalciferol 25 MCG (1000 UNIT) tablet Commonly known as: VITAMIN D3 Take 1,000 Units by mouth daily.   fluticasone 50 MCG/ACT nasal spray Commonly known as: FLONASE Place 2 sprays into both nostrils daily.   guaiFENesin 600 MG 12 hr tablet Commonly known as: Mucinex Take 2 tablets (1,200 mg total) by mouth 2 (two) times daily.   meloxicam 15 MG tablet Commonly known as: MOBIC TAKE 1 TABLET (15 MG TOTAL) BY MOUTH DAILY.   pantoprazole 40 MG tablet Commonly known as: PROTONIX Take 1 tablet (40 mg total) by mouth 2 (two) times daily.   triamcinolone cream 0.5 % Commonly known as: KENALOG Apply 1 Application topically 3 (three) times daily.         REVIEW OF SYSTEMS: Pertinent positives and negatives discussed in HPI.   Objective:   Physical Exam: BP 136/84 (BP Location: Left Arm, Patient Position: Sitting, Cuff Size: Large)   Pulse 71   Temp 98 F (36.7 C) (Temporal)   Resp 20   Ht 5\' 2"  (1.575 m)   Wt 226 lb 4.8 oz (102.6 kg)   SpO2 97%   BMI 41.39 kg/m  Body mass index is 41.39 kg/m. GEN: alert,  well developed HEENT: clear conjunctiva, TM grey and translucent, nose with + inferior turbinate hypertrophy, pale nasal mucosa, slight clear rhinorrhea, + cobblestoning HEART: regular rate and rhythm, no murmur LUNGS: clear to auscultation bilaterally, no coughing, unlabored respiration ABDOMEN: soft, non distended  SKIN: no rashes or lesions  Reviewed:  06/19/2022: seen in ED for cough, congestion, vomiting, headaches and fever. Normal lung exam. Discussed likely flu like illness.  Started on Tamiflu, Zofran, Tessalon Perles.    04/01/2022: seen by Dayton Scrape NP for cough and wheezing. Normal lung exam.  Switched from Advair to [facility].  Started on azithromycin and prednisone.  Nebs PRN. Referred  to Allergy.  03/12/2022: saw Beck NP for cough, wheezing, congestion, body aches and fever. Started on prednisone, augmentin, Flonase, albuterol PRN, mucinex.    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 Topical steroids and started on doxycyline and intralesional steroids.   Spirometry:  Tracings reviewed. Her effort: Good reproducible efforts. FVC: 1.92L FEV1: 1.60L, 84% predicted FEV1/FVC ratio: 83% Interpretation: Spirometry consistent with normal pattern.  Please see scanned spirometry results for details.  Skin Testing:  Skin prick testing was placed, which includes aeroallergens/foods, histamine control, and saline control.  Verbal consent was obtained prior to placing test.  Patient tolerated procedure well.  Allergy testing results were read and interpreted by myself, documented by clinical staff. Adequate positive and negative control.  Results discussed with patient/family.  Airborne Adult Perc - 08/22/22 1422     Time Antigen Placed 1422    Allergen Manufacturer Waynette Buttery    Location Back    Number of Test 55    Panel 1 Select    1. Control-Buffer 50% Glycerol Negative    2. Control-Histamine 3+    3. Bahia Negative    4. French Southern Territories Negative    5.  Johnson Negative    6. Kentucky Blue Negative    7. Meadow Fescue Negative    8. Perennial Rye Negative    9. Timothy Negative    10. Ragweed Mix Negative    11. Cocklebur Negative    12. Plantain,  English Negative    13. Baccharis Negative    14. Dog Fennel Negative    15. Russian Thistle Negative    16. Lamb's Quarters Negative    17. Sheep Sorrell Negative    18. Rough Pigweed Negative    19. Marsh Elder, Rough Negative    20. Mugwort, Common Negative    21. Box, Elder Negative    22. Cedar, red Negative    23. Sweet Gum Negative    24. Pecan Pollen Negative    25. Pine Mix Negative    26. Walnut, Black Pollen Negative    27. Red Mulberry Negative    28. Ash Mix Negative    29. Birch Mix Negative    30. Beech American Negative    31. Cottonwood, Guinea-Bissau Negative    32. Hickory, White Negative    33. Maple Mix Negative    34. Oak, Guinea-[place]issau Mix Negative    35. Sycamore Eastern Negative    36. Alternaria Alternata Negative    38. Aspergillus Mix Negative    39. Penicillium Mix Negative    40. Bipolaris Sorokiniana (Helminthosporium) Negative    41. Drechslera Spicifera (Curvularia) Negative    42. Mucor Plumbeus Negative    43. Fusarium Moniliforme Negative    44. Aureobasidium Pullulans (pullulara) Negative    45. Rhizopus Oryzae Negative    46. Botrytis Cinera 3+    47. Epicoccum Nigrum Negative    48. Phoma Betae 3+    49. Dust Mite Mix 2+    50. Cat Hair 10,000 BAU/ml Negative    51.  Dog Epithelia Negative    52. Mixed Feathers Negative    53. Horse Epithelia 3+    54. Cockroach, German Negative    55. Tobacco Leaf Negative             Intradermal - 08/22/22 1522     Time Antigen Placed 1522    Allergen Manufacturer Waynette Buttery    Location Back    Number of Test 15  Control Negative    Bahia 2+    French Southern Territories Negative    Johnson 2+    7 Grass Negative    Ragweed Mix Negative    Weed Mix Negative    Tree Mix Negative    Mold 1 2+    Mold 2 Negative     Mold 3 2+    Mold 4 Negative    Cat Negative    Dog Negative    Cockroach Negative             Food Adult Perc - 08/22/22 1500     Time Antigen Placed 1422    Allergen Manufacturer Greer    Location Back    Number of allergen test 1    60. Strawberry Negative               Assessment:   1. Mild persistent asthma without complication   2. Gastroesophageal reflux disease without esophagitis   3. Adverse food reaction, initial encounter   4. Non-seasonal allergic rhinitis due to pollen   5. Allergic rhinitis caused by mold   6. Allergic rhinitis due to dust mite   7. Allergic rhinitis due to animal hair or dander     Plan/Recommendations:  Mild Persistent asthma  - Reduce second hand smoke exposure.  Husband is already working on smoking cessation! - Spirometry today was normal on ICS/LABA.  Has had symptomatic improvement with Breo.  - Maintenance inhaler: continue Breo 200-45mcg 1 puff daily.  - Rescue inhaler: Albuterol 2 puffs via spacer or 1 vial via nebulizer every 4-6 hours as needed for respiratory symptoms of cough, shortness of breath, or wheezing Asthma control goals:  Full participation in all desired activities (may need albuterol before activity) Albuterol use two times or less a week on average (not counting use with activity) Cough interfering with sleep two times or less a month Oral steroids no more than once a year No hospitalizations   Allergic Rhinitis: - Due to turbinate hypertrophy, seasonal flare ups and unresponsive to OTC meds, performed skin testing to identify aeroallergen triggers.   - Positive skin test 08/2022: grasses, molds, dust mite, horses  - Avoidance measures discussed. - Use nasal saline rinses before nose sprays such as with Neilmed Sinus Rinse.  Use distilled water.   - Use Flonase 2 sprays each nostril daily. Aim upward and outward. - Use Zyrtec 10 mg daily.  - Consider allergy shots as long term control of your  symptoms by teaching your immune system to be more tolerant of your allergy triggers.   GERD - On Protonix 20mg  twice daily.  Try to wean to 20mg  daily if symptoms are controlled.  -Avoid lying down for at least two hours after a meal or after drinking acidic beverages, like soda, or other caffeinated beverages. This can help to prevent stomach contents from flowing back into the esophagus. -Keep your head elevated while you sleep. Using an extra pillow or two can also help to prevent reflux. -Eat smaller and more frequent meals each day instead of a few large meals. This promotes digestion and can aid in preventing heartburn. -Wear loose-fitting clothes to ease pressure on the stomach, which can worsen heartburn and reflux. -Reduce excess weight around the midsection. This can ease pressure on the stomach. Such pressure can force some stomach contents back up the esophagus.    Pruritus with Strawberry - Negative skin testing to strawberry today 08/2022 so do not think this is a food allergy.  Likely an intolerance.      Return in about 6 weeks (around 10/03/2022).  Alesia Morin, MD [facility]

## 2022-08-27 ENCOUNTER — Other Ambulatory Visit: Payer: Self-pay | Admitting: Family

## 2022-08-27 DIAGNOSIS — Z0000 Encounter for general adult medical examination without abnormal findings: Secondary | ICD-10-CM

## 2022-09-05 ENCOUNTER — Ambulatory Visit
Admission: RE | Admit: 2022-09-05 | Discharge: 2022-09-05 | Disposition: A | Discharge status: Home / Self Care | Payer: BC Managed Care – PPO | Source: Ambulatory Visit | Attending: Family | Admitting: Family

## 2022-09-05 DIAGNOSIS — Z0000 Encounter for general adult medical examination without abnormal findings: Secondary | ICD-10-CM

## 2022-09-12 ENCOUNTER — Other Ambulatory Visit: Payer: Self-pay | Admitting: Family

## 2022-09-14 ENCOUNTER — Other Ambulatory Visit: Payer: Self-pay | Admitting: Internal Medicine

## 2022-10-08 ENCOUNTER — Other Ambulatory Visit: Payer: Self-pay | Admitting: Internal Medicine

## 2022-10-08 ENCOUNTER — Ambulatory Visit: Payer: BC Managed Care – PPO | Admitting: Internal Medicine

## 2022-12-20 ENCOUNTER — Other Ambulatory Visit: Payer: Self-pay | Admitting: Family

## 2023-01-02 ENCOUNTER — Telehealth: Payer: Self-pay | Admitting: Family

## 2023-01-02 NOTE — Telephone Encounter (Signed)
Pt called to request a refill on her eczema cream-  triamcinolone cream (KENALOG) 0.5 %. Please send to [facility] on [address]

## 2023-01-06 ENCOUNTER — Other Ambulatory Visit: Payer: Self-pay | Admitting: Family

## 2023-01-06 MED ORDER — TRIAMCINOLONE ACETONIDE 0.5 % EX CREA: 1.0000 | TOPICAL_CREAM | Freq: Three times a day (TID) | CUTANEOUS | 0 refills | Status: DC

## 2023-03-01 ENCOUNTER — Other Ambulatory Visit: Payer: Self-pay | Admitting: Family

## 2023-03-16 ENCOUNTER — Other Ambulatory Visit: Payer: Self-pay | Admitting: Family

## 2023-03-16 DIAGNOSIS — K2100 Gastro-esophageal reflux disease with esophagitis, without bleeding: Secondary | ICD-10-CM

## 2023-03-17 ENCOUNTER — Encounter: Payer: Self-pay | Admitting: Family Medicine

## 2023-03-17 ENCOUNTER — Ambulatory Visit (INDEPENDENT_AMBULATORY_CARE_PROVIDER_SITE_OTHER): Payer: BC Managed Care – PPO | Admitting: Family Medicine

## 2023-03-17 VITALS — BP 128/82 | HR 68 | Temp 98.0°F | Resp 16 | Ht 62.0 in | Wt 217.8 lb

## 2023-03-17 DIAGNOSIS — J189 Pneumonia, unspecified organism: Secondary | ICD-10-CM

## 2023-03-17 MED ORDER — CETIRIZINE HCL 10 MG PO TABS: 10.0000 mg | ORAL_TABLET | Freq: Every day | ORAL | 1 refills | Status: DC

## 2023-03-17 MED ORDER — AZITHROMYCIN 250 MG PO TABS: ORAL_TABLET | ORAL | 0 refills | Status: DC

## 2023-03-17 NOTE — Patient Instructions (Signed)
 Continue to push fluids, practice good hand hygiene, and cover your mouth if you cough. ? ?If you start having fevers, shaking or shortness of breath, seek immediate care. ? ?OK to take Tylenol 1000 mg (2 extra strength tabs) or 975 mg (3 regular strength tabs) every 6 hours as needed. ? ?Let us know if you need anything. ?

## 2023-03-17 NOTE — Progress Notes (Signed)
 Chief Complaint  Patient presents with   Cough    Discuss Cough    Heidi Stevens here for URI complaints.  Duration: 2 weeks  Associated symptoms: sinus congestion, rhinorrhea, wheezing, and productive cough Denies: sinus pain, itchy watery eyes, ear pain, ear drainage, SOB, sore throat, myalgia, and fevers Treatment to date: Mucinex , SABA Sick contacts: No  Past Medical History:  Diagnosis Date   Anemia    Arthritis    hip, right knee    Asthma    Fibroids    GERD (gastroesophageal reflux disease)    H/O menorrhagia    H/O sinusitis    Syncope    2017 due to dehydration     Objective BP 128/82   Pulse 68   Temp 98 F (36.7 C) (Oral)   Resp 16   Ht 5' 2 (1.575 m)   Wt 217 lb 12.8 oz (98.8 kg)   SpO2 97%   BMI 39.84 kg/m  General: Awake, alert, appears stated age HEENT: AT, [place], ears patent b/l and TM's neg, nares patent w/o discharge, pharynx pink and without exudates, MMM, no sinus ttp bl Neck: No masses or asymmetry Heart: RRR Lungs: CTAB, no accessory muscle use Psych: Age appropriate judgment and insight, normal mood and affect  Walking pneumonia - Plan: azithromycin  (ZITHROMAX ) 250 MG tablet  No wheezing, Zpak. Send message if no better. Continue to push fluids, practice good hand hygiene, cover mouth when coughing. F/u prn. If starting to experience fevers, shaking, or shortness of breath, seek immediate care. Pt voiced understanding and agreement to the plan.  Mabel Mt Herlong, DO 03/17/23 11:19 AM

## 2023-04-01 ENCOUNTER — Other Ambulatory Visit: Payer: Self-pay

## 2023-04-01 ENCOUNTER — Ambulatory Visit: Payer: Medicare Other | Admitting: Internal Medicine

## 2023-04-01 ENCOUNTER — Encounter: Payer: Self-pay | Admitting: Internal Medicine

## 2023-04-01 VITALS — BP 122/70 | HR 62 | Temp 97.8°F | Resp 20 | Ht 63.0 in | Wt 224.0 lb

## 2023-04-01 DIAGNOSIS — J302 Other seasonal allergic rhinitis: Secondary | ICD-10-CM

## 2023-04-01 DIAGNOSIS — J4530 Mild persistent asthma, uncomplicated: Secondary | ICD-10-CM | POA: Diagnosis not present

## 2023-04-01 DIAGNOSIS — K219 Gastro-esophageal reflux disease without esophagitis: Secondary | ICD-10-CM

## 2023-04-01 DIAGNOSIS — J3089 Other allergic rhinitis: Secondary | ICD-10-CM

## 2023-04-01 MED ORDER — FLUTICASONE FUROATE-VILANTEROL 200-25 MCG/ACT IN AEPB: 1.0000 | INHALATION_SPRAY | Freq: Every day | RESPIRATORY_TRACT | 5 refills | Status: DC

## 2023-04-01 MED ORDER — FLUTICASONE PROPIONATE 50 MCG/ACT NA SUSP: 2.0000 | Freq: Every day | NASAL | 5 refills | Status: DC

## 2023-04-01 MED ORDER — ALBUTEROL SULFATE HFA 108 (90 BASE) MCG/ACT IN AERS: 1.0000 | INHALATION_SPRAY | Freq: Four times a day (QID) | RESPIRATORY_TRACT | 1 refills | Status: DC | PRN

## 2023-04-01 NOTE — Patient Instructions (Addendum)
 Mild Persistent asthma  - Reduce second hand smoke exposure.   - Maintenance inhaler: continue Breo 200-25mcg 1 puff daily.  - Rescue inhaler: Albuterol  2 puffs via spacer or 1 vial via nebulizer every 4-6 hours as needed for respiratory symptoms of cough, shortness of breath, or wheezing Asthma control goals:  Full participation in all desired activities (may need albuterol  before activity) Albuterol  use two times or less a week on average (not counting use with activity) Cough interfering with sleep two times or less a month Oral steroids no more than once a year No hospitalizations   Allergic Rhinitis: - Positive skin test 08/2022: grasses, molds, dust mite, horses  - Avoidance measures discussed. - Use nasal saline rinses before nose sprays such as with Neilmed Sinus Rinse.  Use distilled water.   - Use Flonase  1-2 sprays each nostril daily. Aim upward and outward. - Use Zyrtec  (Cetirizine ) 10 mg daily.  - Consider allergy  shots as long term control of your symptoms by teaching your immune system to be more tolerant of your allergy  triggers.   GERD - On Protonix  40mg  daily. Take on empty stomach.  -Avoid lying down for at least two hours after a meal or after drinking acidic beverages, like soda, or other caffeinated beverages. This can help to prevent stomach contents from flowing back into the esophagus. -Keep your head elevated while you sleep. Using an extra pillow or two can also help to prevent reflux. -Eat smaller and more frequent meals each day instead of a few large meals. This promotes digestion and can aid in preventing heartburn. -Wear loose-fitting clothes to ease pressure on the stomach, which can worsen heartburn and reflux. -Reduce excess weight around the midsection. This can ease pressure on the stomach. Such pressure can force some stomach contents back up the esophagus.

## 2023-04-01 NOTE — Addendum Note (Signed)
 Addended by: Merwyn Achilles on: 04/01/2023 04:02 PM   Modules accepted: Orders

## 2023-04-01 NOTE — Progress Notes (Signed)
 FOLLOW UP Date of Service/Encounter:  04/01/23   Subjective:  Heidi Stevens (DOB: 09/25/1955) is a 68 y.o. female who [facility] for follow up for asthma, allergic rhinitis, GERD.   History obtained from: chart review and patient.  Last visit was on 08/22/2022 with me and was doing well on Breo, discussed avoidance of second hand smoke exposure.  Started on Flonase /Zyrtec  for allergies.  On PPI BID for GERD.  Asthma Control Test: ACT Total Score: 19.    Since last visit, reports developing a cough at the end of December that persisted for several weeks.  Also had thick mucous that she was coughing up with it.  No fevers.  Saw PCP and was treated with azithromycin  which helped resolve it.  No more coughing.  Denies any trouble with SOB/wheezing, sometimes feels chest burning.  On Breo daily. Mostly uses albuterol  as pretreatment prior to gym but otherwise rarely needs it.  Her husband is still smoking but working on cutting back.   Allergies are well controlled.  She takes Flonase  and Zyrtec  daily and has not noted much congestion, drainage, runny nose, sneezing with it.    Denies sxs of reflux, heartburn, vomiting, nausea, AM cough.  On Protonix  daily.    Past Medical History: Past Medical History:  Diagnosis Date   Anemia    Arthritis    hip, right knee    Asthma    Fibroids    GERD (gastroesophageal reflux disease)    H/O menorrhagia    H/O sinusitis    Syncope    2017 due to dehydration     Objective:  BP 122/70 (BP Location: Left Arm, Patient Position: Sitting, Cuff Size: Normal)   Pulse 62   Temp 97.8 F (36.6 C) (Temporal)   Resp 20   Ht 5\' 3"  (1.6 m)   Wt 224 lb (101.6 kg)   SpO2 98%   BMI 39.68 kg/m  Body mass index is 39.68 kg/m. Physical Exam: GEN: alert, well developed HEENT: clear conjunctiva, nose with mild inferior turbinate hypertrophy, pink nasal mucosa,no rhinorrhea, no cobblestoning HEART: regular  rate and rhythm, no murmur LUNGS: clear to auscultation bilaterally, no coughing, unlabored respiration SKIN: no rashes or lesions  Spirometry:  Tracings reviewed. Her effort: Good reproducible efforts. FVC: 2.04L, 85% predicted  FEV1: 1.51L, 80% predicted FEV1/FVC ratio: 74% Interpretation: Spirometry consistent with normal pattern.  Please see scanned spirometry results for details.  Assessment:   1. Seasonal and perennial allergic rhinitis   2. Gastroesophageal reflux disease without esophagitis   3. Mild persistent asthma without complication     Plan/Recommendations:  Mild Persistent asthma  - Reduce second hand smoke exposure.  Controlled with normal spirometry on ICS/LABA. MDI technique discussed.  - Maintenance inhaler: continue Breo 200-25mcg 1 puff daily.  - Rescue inhaler: Albuterol  2 puffs via spacer or 1 vial via nebulizer every 4-6 hours as needed for respiratory symptoms of cough, shortness of breath, or wheezing Asthma control goals:  Full participation in all desired activities (may need albuterol  before activity) Albuterol  use two times or less a week on average (not counting use with activity) Cough interfering with sleep two times or less a month Oral steroids no more than once a year No hospitalizations   Allergic Rhinitis: - Controlled  - Positive skin test 08/2022: grasses, molds, dust mite, horses  - Avoidance measures discussed. - Use nasal saline rinses before nose sprays such as with Neilmed Sinus  Rinse.  Use distilled water.   - Use Flonase  1-2 sprays each nostril daily. Aim upward and outward. - Use Zyrtec  (Cetirizine ) 10 mg daily.  - Consider allergy  shots as long term control of your symptoms by teaching your immune system to be more tolerant of your allergy  triggers.   GERD - Controlled. On Protonix  40mg  daily. Take on empty stomach.  -Avoid lying down for at least two hours after a meal or after drinking acidic beverages, like soda, or other  caffeinated beverages. This can help to prevent stomach contents from flowing back into the esophagus. -Keep your head elevated while you sleep. Using an extra pillow or two can also help to prevent reflux. -Eat smaller and more frequent meals each day instead of a few large meals. This promotes digestion and can aid in preventing heartburn. -Wear loose-fitting clothes to ease pressure on the stomach, which can worsen heartburn and reflux. -Reduce excess weight around the midsection. This can ease pressure on the stomach. Such pressure can force some stomach contents back up the esophagus.      Return in about 6 months (around 09/29/2023).  Kristen Petri, MD [facility] 

## 2023-04-09 ENCOUNTER — Other Ambulatory Visit: Payer: Self-pay | Admitting: Family

## 2023-04-14 ENCOUNTER — Other Ambulatory Visit: Payer: Self-pay

## 2023-04-14 ENCOUNTER — Ambulatory Visit: Payer: Medicare Other | Admitting: Family

## 2023-04-14 ENCOUNTER — Other Ambulatory Visit (HOSPITAL_BASED_OUTPATIENT_CLINIC_OR_DEPARTMENT_OTHER): Payer: Self-pay

## 2023-04-14 ENCOUNTER — Encounter: Payer: Self-pay | Admitting: Family

## 2023-04-14 VITALS — BP 122/84 | HR 75 | Ht 63.0 in | Wt 223.0 lb

## 2023-04-14 DIAGNOSIS — E2839 Other primary ovarian failure: Secondary | ICD-10-CM

## 2023-04-14 DIAGNOSIS — Z13820 Encounter for screening for osteoporosis: Secondary | ICD-10-CM | POA: Diagnosis not present

## 2023-04-14 DIAGNOSIS — Z7722 Contact with and (suspected) exposure to environmental tobacco smoke (acute) (chronic): Secondary | ICD-10-CM

## 2023-04-14 DIAGNOSIS — R7303 Prediabetes: Secondary | ICD-10-CM

## 2023-04-14 DIAGNOSIS — Z13220 Encounter for screening for lipoid disorders: Secondary | ICD-10-CM

## 2023-04-14 DIAGNOSIS — E559 Vitamin D deficiency, unspecified: Secondary | ICD-10-CM | POA: Diagnosis not present

## 2023-04-14 DIAGNOSIS — Z23 Encounter for immunization: Secondary | ICD-10-CM

## 2023-04-14 DIAGNOSIS — Z0000 Encounter for general adult medical examination without abnormal findings: Secondary | ICD-10-CM

## 2023-04-14 MED ORDER — CETIRIZINE HCL 10 MG PO TABS
10.0000 mg | ORAL_TABLET | Freq: Every day | ORAL | 1 refills | Status: AC
  Filled 2023-04-14: qty 30, 30d supply, fill #0

## 2023-04-14 NOTE — Patient Instructions (Addendum)
Stop at the pharmacy and get them to price Zyrtec for you;   Your bone density has been ordered for June 2025- they will call you to schedule;   Consider getting your Shingles vaccine as discussed;   I have ordered the lung cancer screen and they will call you to schedule;

## 2023-04-14 NOTE — Progress Notes (Signed)
Heidi Stevens is a 68 y.o. 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   Primary localized osteoarthrosis, lower leg 07/11/2013   IT band syndrome 07/11/2013   Obesity, Class II, BMI 35-39.9 05/31/2013   Acute meniscal tear of knee, subsequent encounter 04/27/2013   Anemia    Fibroids    H/O sinusitis    GERD (gastroesophageal reflux disease) 10/24/2010   Healthcare maintenance 10/24/2010   ALLERGIC RHINITIS CAUSE UNSPECIFIED 07/03/2009    Current Outpatient Medications  Medication Sig Dispense Refill   albuterol (PROVENTIL) (2.5 MG/3ML) 0.083% nebulizer solution Take 3 mLs (2.5 mg total) by nebulization every 6 (six) hours as needed for wheezing or shortness of breath. 75 mL 1   albuterol (VENTOLIN HFA) 108 (90 Base) MCG/ACT inhaler Inhale 1-2 puffs into the lungs every 6 (six) hours as needed for wheezing or shortness of breath. 18 g 1   CALCIUM PO Take by mouth.     cholecalciferol (VITAMIN D3) 25 MCG (1000 UNIT) tablet Take 1,000 Units by mouth daily.     fluticasone (FLONASE) 50 MCG/ACT nasal spray Place 2 sprays into both nostrils daily. 16 g 5   fluticasone furoate-vilanterol (BREO ELLIPTA) 200-25 MCG/ACT AEPB Inhale 1 puff into the lungs daily. 60 each 5   meloxicam (MOBIC) 15 MG tablet TAKE 1 TABLET (15 MG TOTAL) BY MOUTH DAILY. 30 tablet 0   pantoprazole (PROTONIX) 40 MG tablet TAKE 1 TABLET BY MOUTH TWICE A DAY 180 tablet 3   triamcinolone cream (KENALOG) 0.5 % Apply 1 Application topically 3 (three) times daily. 30 g 0   cetirizine (ZYRTEC ALLERGY) 10 MG tablet Take 1 tablet (10 mg total) by mouth daily. 90 tablet 1   No  current facility-administered medications for this visit.    Allergies: Patient has no known allergies.  Past Medical History:  Diagnosis Date   Anemia    Arthritis    hip, right knee    Asthma    Fibroids    GERD (gastroesophageal reflux disease)    H/O menorrhagia    H/O sinusitis    Syncope    2017 due to dehydration     Past Surgical History:  Procedure Laterality Date   arthrsocopy knee     right knee in '04   BREAST EXCISIONAL BIOPSY Right    BREAST SURGERY  1985   reduction mammoplsty   COLONOSCOPY     G2P2     REDUCTION MAMMAPLASTY Bilateral 1985   TOTAL ABDOMINAL HYSTERECTOMY W/ BILATERAL SALPINGOOPHORECTOMY  07/06/07   WISDOM TOOTH EXTRACTION  1980    Family History  Problem Relation Age of Onset   Diabetes Mother    Hypertension Mother    Stroke Mother    Hypertension Father    Diabetes Father    Stroke Father    Liver cancer Father    Diabetes Paternal Grandmother    Asthma Son    Breast cancer Sister    Colon cancer Neg Hx    Colon polyps Neg Hx    Esophageal cancer Neg Hx    Rectal cancer Neg Hx    Stomach cancer Neg Hx     Social History   Tobacco  Use   Smoking status: Former   Smokeless tobacco: Never  Substance Use Topics   Alcohol use: No    Subjective:   Presents for yearly CPE; no acute concerns today; is continuing to work with her asthma/ allergy specialist;   Review of Systems  Constitutional: Negative.   HENT: Negative.    Eyes: Negative.   Respiratory: Negative.    Cardiovascular: Negative.   Gastrointestinal: Negative.   Genitourinary: Negative.   Musculoskeletal: Negative.   Skin: Negative.   Neurological: Negative.   Endo/Heme/Allergies: Negative.   Psychiatric/Behavioral: Negative.       Objective:  Vitals:   04/14/23 1424  BP: 122/84  Pulse: 75  SpO2: 99%  Weight: 223 lb (101.2 kg)  Height: 5\' 3"  (1.6 m)    General: Well developed, well nourished, in no acute distress  Skin : Warm and dry.  Head:  Normocephalic and atraumatic  Eyes: Sclera and conjunctiva clear; pupils round and reactive to light; extraocular movements intact  Ears: External normal; canals clear; tympanic membranes normal  Oropharynx: Pink, supple. No suspicious lesions  Neck: Supple without thyromegaly, adenopathy  Lungs: Respirations unlabored; clear to auscultation bilaterally without wheeze, rales, rhonchi  CVS exam: normal rate and regular rhythm.  Abdomen: Soft; nontender; nondistended; normoactive bowel sounds; no masses or hepatosplenomegaly  Musculoskeletal: No deformities; no active joint inflammation  Extremities: No edema, cyanosis, clubbing  Vessels: Symmetric bilaterally  Neurologic: Alert and oriented; speech intact; face symmetrical; moves all extremities well; CNII-XII intact without focal deficit   Assessment:  1. PE (physical exam), annual   2. Obesity, morbid (HCC) Chronic  3. Osteoporosis screening   4. Ovarian failure   5. Second hand smoke exposure   6. Lipid screening   7. Pre-diabetes   8. Vitamin D deficiency   9. Need for influenza vaccination   10. Need for pneumococcal 20-valent conjugate vaccination     Plan:  Age appropriate preventive healthcare needs addressed; encouraged regular eye doctor and dental exams; encouraged regular exercise; will update labs and refills as needed today; follow-up to be determined;   No follow-ups on file.  Orders Placed This Encounter  Procedures   DG Bone Density    Standing Status:   Future    Expiration Date:   04/13/2024    Scheduling Instructions:     Please schedule with mammogram in June 2025    Reason for Exam (SYMPTOM  OR DIAGNOSIS REQUIRED):   ovarian failure/ osteoporosis screening    Preferred imaging location?:   [facility]   Flu Vaccine Trivalent High Dose (Fluad)   Pneumococcal conjugate vaccine 20-valent (Prevnar 20)   CBC with Differential/Platelet   Comp Met (CMET)   Lipid panel   Hemoglobin A1c   Vitamin D (25  hydroxy)   Ambulatory Referral Lung Cancer Screening [facility]    Referral Priority:   Routine    Referral Type:   Consultation    Referral Reason:   Specialty Services Required    Number of Visits Requested:   1    Requested Prescriptions   Signed Prescriptions Disp Refills   cetirizine (ZYRTEC ALLERGY) 10 MG tablet 90 tablet 1    Sig: Take 1 tablet (10 mg total) by mouth daily.

## 2023-04-15 ENCOUNTER — Other Ambulatory Visit: Payer: Self-pay

## 2023-04-15 LAB — COMPREHENSIVE METABOLIC PANEL
ALT: 10 U/L (ref 0–35)
AST: 16 U/L (ref 0–37)
Albumin: 3.9 g/dL (ref 3.5–5.2)
Alkaline Phosphatase: 76 U/L (ref 39–117)
BUN: 24 mg/dL — ABNORMAL HIGH (ref 6–23)
CO2: 29 meq/L (ref 19–32)
Calcium: 9.2 mg/dL (ref 8.4–10.5)
Chloride: 102 meq/L (ref 96–112)
Creatinine, Ser: 1.05 mg/dL (ref 0.40–1.20)
GFR: 55.79 mL/min — ABNORMAL LOW (ref >60.00)
Glucose, Bld: 75 mg/dL (ref 70–99)
Potassium: 4.2 meq/L (ref 3.5–5.1)
Sodium: 138 meq/L (ref 135–145)
Total Bilirubin: 0.3 mg/dL (ref 0.2–1.2)
Total Protein: 7.3 g/dL (ref 6.0–8.3)

## 2023-04-15 LAB — CBC WITH DIFFERENTIAL/PLATELET
Basophils Absolute: 0.1 10*3/uL (ref 0.0–0.1)
Basophils Relative: 1.2 % (ref 0.0–3.0)
Eosinophils Absolute: 0.2 10*3/uL (ref 0.0–0.7)
Eosinophils Relative: 2.7 % (ref 0.0–5.0)
HCT: 37.5 % (ref 36.0–46.0)
Hemoglobin: 12.1 g/dL (ref 12.0–15.0)
Lymphocytes Relative: 29.5 % (ref 12.0–46.0)
Lymphs Abs: 1.9 10*3/uL (ref 0.7–4.0)
MCHC: 32.3 g/dL (ref 30.0–36.0)
MCV: 84.4 fL (ref 78.0–100.0)
Monocytes Absolute: 0.5 10*3/uL (ref 0.1–1.0)
Monocytes Relative: 8 % (ref 3.0–12.0)
Neutro Abs: 3.8 10*3/uL (ref 1.4–7.7)
Neutrophils Relative %: 58.6 % (ref 43.0–77.0)
Platelets: 320 10*3/uL (ref 150.0–400.0)
RBC: 4.44 Mil/uL (ref 3.87–5.11)
RDW: 16.2 % — ABNORMAL HIGH (ref 11.5–15.5)
WBC: 6.5 10*3/uL (ref 4.0–10.5)

## 2023-04-15 LAB — LIPID PANEL
Cholesterol: 168 mg/dL (ref 0–200)
HDL: 54.6 mg/dL (ref >39.00)
LDL Cholesterol: 98 mg/dL (ref 0–99)
NonHDL: 113.75
Total CHOL/HDL Ratio: 3
Triglycerides: 77 mg/dL (ref 0.0–149.0)
VLDL: 15.4 mg/dL (ref 0.0–40.0)

## 2023-04-15 LAB — VITAMIN D 25 HYDROXY (VIT D DEFICIENCY, FRACTURES): VITD: 38.48 ng/mL (ref 30.00–100.00)

## 2023-04-15 LAB — HEMOGLOBIN A1C: Hgb A1c MFr Bld: 6.4 % (ref 4.6–6.5)

## 2023-04-16 ENCOUNTER — Encounter: Payer: Self-pay | Admitting: Physician Assistant

## 2023-04-16 ENCOUNTER — Ambulatory Visit: Payer: Self-pay | Admitting: Family

## 2023-04-16 ENCOUNTER — Ambulatory Visit: Payer: Medicare Other | Admitting: Physician Assistant

## 2023-04-16 VITALS — BP 129/80 | HR 81 | Temp 98.7°F | Ht 63.0 in | Wt 220.1 lb

## 2023-04-16 DIAGNOSIS — T50Z95A Adverse effect of other vaccines and biological substances, initial encounter: Secondary | ICD-10-CM

## 2023-04-16 MED ORDER — TRIAMCINOLONE ACETONIDE 0.1 % EX CREA: 1.0000 | TOPICAL_CREAM | Freq: Two times a day (BID) | CUTANEOUS | 0 refills | Status: DC

## 2023-04-16 MED ORDER — METHYLPREDNISOLONE 4 MG PO TBPK: ORAL_TABLET | ORAL | 0 refills | Status: DC

## 2023-04-16 NOTE — Telephone Encounter (Signed)
  Chief Complaint: arm swelling Symptoms: localized arm swelling, redness, nausea, body aches, chills, itching Frequency: since yesterday Pertinent Negatives: Patient denies fever, sob Disposition: [] ED /[] Urgent Care (no appt availability in office) / [x] Appointment(In office/virtual)/ []  [facility]/ [] Home Care/ [] Refused Recommended Disposition /[] [facility]/ []  Follow-up with PCP Additional Notes: Patient reports she has been experiencing what she believes may be an allergic reaction to either the flu or pneumonia shot. Patient reports she received both shots on Tuesday and woke up yesterday with localized swelling and redness of her right arm, mild pain that radiates underneath her armpit and towards her back, body aches, nausea, itching of her right forearm, and chills. Patient reports she has never had this type of reaction to her flu shot but that she has never had a pneumonia shot before and believes that may be the cause. Patient reports she is concerned and would like an appt to make sure it is nothing serious. Per protocol, patient scheduled appt today 1/30. Patient advised to call back with worsening symptoms. Patient verbalized understanding.   Copied from CRM [id number]. Topic: Clinical - Red Word Triage >> Apr 16, 2023  8:13 AM Leavy Cella D wrote: Red Word that prompted transfer to Nurse Triage: Received flu/pneumonia shot Tuesday . Patient is not experiencing swelling in right arm and upper body aches Reason for Disposition  [1] Small area of localized swelling AND [2] not better after 3 days  Answer Assessment - Initial Assessment Questions 1. ONSET: "When did the swelling start?" (e.g., minutes, hours, days)     yesterday 2. LOCATION: "What part of the arm is swollen?"  "Are both arms swollen or just one arm?"     Right arm 3. SEVERITY: "How bad is the swelling?" (e.g., localized; mild, moderate, severe)   - LOCALIZED: Small area of puffiness or swelling on  just one arm   - JOINT SWELLING: Swelling of one joint   - MILD: Puffiness or swelling of hand   - MODERATE: Puffiness or swollen feeling of entire arm    - SEVERE: All of arm looks swollen; pitting edema     localized 4. REDNESS: "Does the swelling look red or infected?"     Red and puffy at site 5. PAIN: "Is the swelling painful to touch?" If Yes, ask: "How painful is it?"   (Scale 1-10; mild, moderate or severe)     mild 6. FEVER: "Do you have a fever?" If Yes, ask: "What is it, how was it measured, and when did it start?"      Unsure, did not check 7. CAUSE: "What do you think is causing the arm swelling?"     Maybe due to my shots 8. MEDICAL HISTORY: "Do you have a history of heart failure, kidney disease, liver failure, or cancer?"     none 9. RECURRENT SYMPTOM: "Have you had arm swelling before?" If Yes, ask: "When was the last time?" "What happened that time?"     none 10. OTHER SYMPTOMS: "Do you have any other symptoms?" (e.g., chest pain, difficulty breathing)       Chills, body aches, nausea  Protocols used: Arm Swelling and Edema-A-AH

## 2023-04-16 NOTE — Progress Notes (Signed)
Established patient visit   Patient: Heidi Stevens   DOB: 16-May-1955   68 y.o. Female  MRN: [id number] Visit Date: 04/16/2023  Today's healthcare provider: Alfredia Ferguson, PA-C   Cc. Vaccine reaction  Subjective     Pt reports receiving both a flu and pneumonia vaccine on Tuesday, x2 days ago, and since then has had redness, swelling, pain in both arms, worse on her right arm. Reports some tenderness under her right armpit that extends to her back. She reports slight increase in SOB, needed her albuterol inhaler this morning.  She feels fatigued, drained.  She takes zyrtec daily and has used motrin/tylenol otc.  Medications: Outpatient Medications Prior to Visit  Medication Sig   albuterol (PROVENTIL) (2.5 MG/3ML) 0.083% nebulizer solution Take 3 mLs (2.5 mg total) by nebulization every 6 (six) hours as needed for wheezing or shortness of breath.   albuterol (VENTOLIN HFA) 108 (90 Base) MCG/ACT inhaler Inhale 1-2 puffs into the lungs every 6 (six) hours as needed for wheezing or shortness of breath.   CALCIUM PO Take by mouth.   cetirizine (ZYRTEC ALLERGY) 10 MG tablet Take 1 tablet (10 mg total) by mouth daily.   cholecalciferol (VITAMIN D3) 25 MCG (1000 UNIT) tablet Take 1,000 Units by mouth daily.   fluticasone (FLONASE) 50 MCG/ACT nasal spray Place 2 sprays into both nostrils daily.   fluticasone furoate-vilanterol (BREO ELLIPTA) 200-25 MCG/ACT AEPB Inhale 1 puff into the lungs daily.   meloxicam (MOBIC) 15 MG tablet TAKE 1 TABLET (15 MG TOTAL) BY MOUTH DAILY.   pantoprazole (PROTONIX) 40 MG tablet TAKE 1 TABLET BY MOUTH TWICE A DAY   [DISCONTINUED] triamcinolone cream (KENALOG) 0.5 % Apply 1 Application topically 3 (three) times daily.   No facility-administered medications prior to visit.    Review of Systems  Constitutional:  Negative for fatigue and fever.  Respiratory:  Negative for cough and shortness of breath.   Cardiovascular:  Negative for chest pain  and leg swelling.  Gastrointestinal:  Negative for abdominal pain.  Skin:  Positive for rash.  Neurological:  Negative for dizziness and headaches.       Objective    BP 129/80   Pulse 81   Temp 98.7 F (37.1 C) (Oral)   Ht 5\' 3"  (1.6 m)   Wt 220 lb 2 oz (99.8 kg)   SpO2 97%   BMI 38.99 kg/m    Physical Exam Vitals reviewed.  Constitutional:      Appearance: She is not ill-appearing.  HENT:     Head: Normocephalic.  Eyes:     Conjunctiva/sclera: Conjunctivae normal.  Cardiovascular:     Rate and Rhythm: Normal rate.  Pulmonary:     Effort: Pulmonary effort is normal. No respiratory distress.  Skin:    Comments: Right arm w/ a 4-5 cm circular erythema, indurated at site of vaccine . Left arm w/ a small amount of flat blanching redness  Neurological:     General: No focal deficit present.     Mental Status: She is alert and oriented to person, place, and time.  Psychiatric:        Mood and Affect: Mood normal.        Behavior: Behavior normal.      No results found for any visits on 04/16/23.  Assessment & Plan    Adverse effect of vaccine, initial encounter -     Triamcinolone Acetonide; Apply 1 Application topically 2 (two) times daily.  Dispense: 30  g; Refill: 0 -     methylPREDNISolone; Take 6 pills on day 1, 5 pills on day 2, 4 pills on day 3, 3 pills on day 4, 2 pills on day 5, and 1 pill on day 6. Please take only if reaction gets worse  Dispense: 1 each; Refill: 0   Recommend bid triamcinolone topically, ice, cont antihistamines O2 normal and lungs clear-- advised if symptoms do not improve, ok to take steroid pack  Unsure which vaccine this was as both were documented in [place]. Advised she will not need another pneumo vaccine, and historically has tolerated flu well before.   Return if symptoms worsen or fail to improve.       Alfredia Ferguson, PA-C  [facility] at [facility] [phone] (phone) [phone]  (fax)  [facility]

## 2023-04-17 ENCOUNTER — Telehealth: Payer: Self-pay

## 2023-04-17 NOTE — Telephone Encounter (Signed)
Copied from CRM [id number]. Topic: Clinical - Lab/Test Results >> Apr 17, 2023  2:29 PM Sonny Dandy B wrote: Reason for CRM: pt called to request lab results, please call pt back at [phone]

## 2023-04-20 ENCOUNTER — Telehealth: Payer: Self-pay

## 2023-04-20 NOTE — Telephone Encounter (Signed)
Spoke with patient by phone to screening for LDCT criteria.  Patient quit smoking over 30 years ago and is not eligible for LDCT. She was concerned about spouses' second hand smoke exposure.  Since criteria is not related to second hand smoke, this patient does not qualify for the LDCT but could perhaps have an alternative type scan, if the referring provider recommended.  Advised the patient we would message the provider to update her that the second hand smoke exposure would not be approved for a LDCT.  Patient acknowledged understanding.

## 2023-04-22 ENCOUNTER — Other Ambulatory Visit: Payer: Self-pay | Admitting: Family

## 2023-04-22 DIAGNOSIS — R7303 Prediabetes: Secondary | ICD-10-CM

## 2023-04-30 ENCOUNTER — Other Ambulatory Visit: Payer: Self-pay | Admitting: Internal Medicine

## 2023-04-30 MED ORDER — FLUTICASONE-SALMETEROL 250-50 MCG/ACT IN AEPB: 1.0000 | INHALATION_SPRAY | Freq: Two times a day (BID) | RESPIRATORY_TRACT | 5 refills | Status: DC

## 2023-04-30 NOTE — Progress Notes (Signed)
Heidi Stevens is no longer covered.  Will switch to [employer].

## 2023-05-13 ENCOUNTER — Other Ambulatory Visit: Payer: Self-pay | Admitting: Family

## 2023-06-13 ENCOUNTER — Other Ambulatory Visit: Payer: Self-pay | Admitting: Family

## 2023-06-24 ENCOUNTER — Other Ambulatory Visit: Payer: Self-pay | Admitting: Internal Medicine

## 2023-07-08 ENCOUNTER — Other Ambulatory Visit (HOSPITAL_BASED_OUTPATIENT_CLINIC_OR_DEPARTMENT_OTHER): Payer: Self-pay

## 2023-07-14 ENCOUNTER — Other Ambulatory Visit: Payer: Self-pay | Admitting: Family

## 2023-07-14 DIAGNOSIS — H524 Presbyopia: Secondary | ICD-10-CM | POA: Diagnosis not present

## 2023-08-13 ENCOUNTER — Other Ambulatory Visit: Payer: Self-pay | Admitting: Family

## 2023-08-22 ENCOUNTER — Other Ambulatory Visit: Payer: Self-pay | Admitting: Physician Assistant

## 2023-08-22 DIAGNOSIS — T50Z95A Adverse effect of other vaccines and biological substances, initial encounter: Secondary | ICD-10-CM

## 2023-08-24 ENCOUNTER — Telehealth: Payer: Self-pay | Admitting: Family

## 2023-08-24 NOTE — Telephone Encounter (Signed)
 Copied from CRM [id number]. Topic: Medicare AWV >> Aug 24, 2023  1:29 PM Juliana Ocean wrote: Reason for CRM: LVM 08/24/2023 to schedule AWV. Please schedule Virtual or Telehealth visits ONLY.   Rosalee Collins; Care Guide Ambulatory Clinical Support [place] [facility] Direct Dial: [phone]

## 2023-09-08 ENCOUNTER — Ambulatory Visit: Payer: Self-pay | Admitting: Family

## 2023-09-08 ENCOUNTER — Ambulatory Visit (HOSPITAL_BASED_OUTPATIENT_CLINIC_OR_DEPARTMENT_OTHER)
Admission: RE | Admit: 2023-09-08 | Discharge: 2023-09-08 | Disposition: A | Discharge status: Home / Self Care | Source: Ambulatory Visit | Attending: Family | Admitting: Family

## 2023-09-08 DIAGNOSIS — E2839 Other primary ovarian failure: Secondary | ICD-10-CM | POA: Diagnosis present

## 2023-09-08 DIAGNOSIS — Z13820 Encounter for screening for osteoporosis: Secondary | ICD-10-CM | POA: Insufficient documentation

## 2023-09-08 DIAGNOSIS — Z780 Asymptomatic menopausal state: Secondary | ICD-10-CM | POA: Diagnosis not present

## 2023-09-10 ENCOUNTER — Other Ambulatory Visit: Payer: Self-pay | Admitting: Family

## 2023-10-06 ENCOUNTER — Telehealth: Payer: Self-pay | Admitting: Family

## 2023-10-06 NOTE — Telephone Encounter (Signed)
 Good morning I need lab orders for this pt

## 2023-10-11 ENCOUNTER — Other Ambulatory Visit: Payer: Self-pay | Admitting: Family

## 2023-10-12 ENCOUNTER — Other Ambulatory Visit: Payer: Self-pay | Admitting: Family

## 2023-10-12 DIAGNOSIS — R7303 Prediabetes: Secondary | ICD-10-CM

## 2023-10-19 ENCOUNTER — Other Ambulatory Visit (INDEPENDENT_AMBULATORY_CARE_PROVIDER_SITE_OTHER): Payer: Medicare Other

## 2023-10-19 DIAGNOSIS — R7303 Prediabetes: Secondary | ICD-10-CM | POA: Diagnosis not present

## 2023-10-19 LAB — COMPREHENSIVE METABOLIC PANEL WITH GFR
ALT: 10 U/L (ref 0–35)
AST: 16 U/L (ref 0–37)
Albumin: 3.9 g/dL (ref 3.5–5.2)
Alkaline Phosphatase: 64 U/L (ref 39–117)
BUN: 15 mg/dL (ref 6–23)
CO2: 27 meq/L (ref 19–32)
Calcium: 9.1 mg/dL (ref 8.4–10.5)
Chloride: 102 meq/L (ref 96–112)
Creatinine, Ser: 1.02 mg/dL (ref 0.40–1.20)
GFR: 57.56 mL/min — ABNORMAL LOW (ref >60.00)
Glucose, Bld: 82 mg/dL (ref 70–99)
Potassium: 4.5 meq/L (ref 3.5–5.1)
Sodium: 138 meq/L (ref 135–145)
Total Bilirubin: 0.3 mg/dL (ref 0.2–1.2)
Total Protein: 7.2 g/dL (ref 6.0–8.3)

## 2023-10-19 LAB — HEMOGLOBIN A1C: Hgb A1c MFr Bld: 5.8 % (ref 4.6–6.5)

## 2023-10-20 ENCOUNTER — Ambulatory Visit: Payer: Self-pay | Admitting: Family

## 2023-10-28 ENCOUNTER — Ambulatory Visit

## 2023-10-28 ENCOUNTER — Other Ambulatory Visit: Payer: Self-pay | Admitting: Internal Medicine

## 2023-11-03 ENCOUNTER — Ambulatory Visit: Payer: Medicare Other | Admitting: Internal Medicine

## 2023-11-06 ENCOUNTER — Ambulatory Visit (INDEPENDENT_AMBULATORY_CARE_PROVIDER_SITE_OTHER): Admitting: Internal Medicine

## 2023-11-06 ENCOUNTER — Encounter: Payer: Self-pay | Admitting: Internal Medicine

## 2023-11-06 ENCOUNTER — Other Ambulatory Visit: Payer: Self-pay

## 2023-11-06 VITALS — BP 130/78 | HR 62 | Temp 98.0°F | Ht 62.21 in | Wt 192.5 lb

## 2023-11-06 DIAGNOSIS — J302 Other seasonal allergic rhinitis: Secondary | ICD-10-CM

## 2023-11-06 DIAGNOSIS — K219 Gastro-esophageal reflux disease without esophagitis: Secondary | ICD-10-CM

## 2023-11-06 DIAGNOSIS — J3089 Other allergic rhinitis: Secondary | ICD-10-CM | POA: Diagnosis not present

## 2023-11-06 DIAGNOSIS — J4530 Mild persistent asthma, uncomplicated: Secondary | ICD-10-CM

## 2023-11-06 MED ORDER — ALBUTEROL SULFATE (2.5 MG/3ML) 0.083% IN NEBU: 2.5000 mg | INHALATION_SOLUTION | Freq: Four times a day (QID) | RESPIRATORY_TRACT | 1 refills | Status: AC | PRN

## 2023-11-06 MED ORDER — PANTOPRAZOLE SODIUM 20 MG PO TBEC: 20.0000 mg | DELAYED_RELEASE_TABLET | Freq: Every day | ORAL | 5 refills | Status: DC

## 2023-11-06 MED ORDER — FLUTICASONE-SALMETEROL 100-50 MCG/ACT IN AEPB: 1.0000 | INHALATION_SPRAY | Freq: Two times a day (BID) | RESPIRATORY_TRACT | 5 refills | Status: DC

## 2023-11-06 MED ORDER — ALBUTEROL SULFATE HFA 108 (90 BASE) MCG/ACT IN AERS: 1.0000 | INHALATION_SPRAY | Freq: Four times a day (QID) | RESPIRATORY_TRACT | 1 refills | Status: DC | PRN

## 2023-11-06 MED ORDER — FLUTICASONE PROPIONATE 50 MCG/ACT NA SUSP: 2.0000 | Freq: Every day | NASAL | 1 refills | Status: DC

## 2023-11-06 NOTE — Patient Instructions (Addendum)
 Mild Persistent Asthma  - Maintenance inhaler: reduce to Wixela 100-7mcg 1 puff twice daily.  - Rescue inhaler: Albuterol  2 puffs via spacer or 1 vial via nebulizer every 4-6 hours as needed for respiratory symptoms of cough, shortness of breath, or wheezing Asthma control goals:  Full participation in all desired activities (may need albuterol  before activity) Albuterol  use two times or less a week on average (not counting use with activity) Cough interfering with sleep two times or less a month Oral steroids no more than once a year No hospitalizations   Allergic Rhinitis: - Positive skin test 08/2022: grasses, molds, dust mite, horses  - Use nasal saline rinses before nose sprays such as with Neilmed Sinus Rinse.  Use distilled water.   - Use Flonase  1-2 sprays each nostril daily. Aim upward and outward. - Use Zyrtec  (Cetirizine ) 10 mg daily as needed for runny nose, sneezing, itchy watery eyes.  - Consider allergy  shots as long term control of your symptoms by teaching your immune system to be more tolerant of your allergy  triggers.   GERD - Reduce to Protonix  20mg  daily. Take on empty stomach.  -Avoid lying down for at least two hours after a meal or after drinking acidic beverages, like soda, or other caffeinated beverages. This can help to prevent stomach contents from flowing back into the esophagus. -Keep your head elevated while you sleep. Using an extra pillow or two can also help to prevent reflux. -Eat smaller and more frequent meals each day instead of a few large meals. This promotes digestion and can aid in preventing heartburn. -Wear loose-fitting clothes to ease pressure on the stomach, which can worsen heartburn and reflux. -Reduce excess weight around the midsection. This can ease pressure on the stomach. Such pressure can force some stomach contents back up the esophagus.

## 2023-11-06 NOTE — Progress Notes (Signed)
 FOLLOW UP Date of Service/Encounter:  11/06/23   Subjective:  Heidi Stevens (DOB: 11-03-1955) is a 68 y.o. female who [facility] for follow up for asthma, allergic rhinitis, GERD.    History obtained from: chart review and patient. Seen by me 04/01/2023 with overall controlled asthma but did have a flare up of coughing prior to the visit where she was treated for walking pneumonia with azithromycin .  Allergic rhinitis and gerd controlled on Flonase , Zyrtec , Protonix .  Since last visit, reports asthma has done very well.  Sometimes even forgets to take her controller medication. She has been going to the gym and watching her diet and losing weight.  Taking Wixela 250-50 1 puff BID.  Takes Albuterol  few times a week prior to gym just because she is worried but there are many times she forgets to use it and still does fine exercising.  No ER/urgent care visits. No oral prednisone  since last visit.  Allergies are fine too.  Not much congestion, sneezing, drainage.  Takes Flonase  and Zyrtec  daily.  GERD is doing well too on Protonix  40mg  daily, no heartburn, chronic cough, sour taste in mouth.  Past Medical History: Past Medical History:  Diagnosis Date   Anemia    Arthritis    hip, right knee    Asthma    Fibroids    GERD (gastroesophageal reflux disease)    H/O menorrhagia    H/O sinusitis    Syncope    2017 due to dehydration     Objective:  BP 130/78 (BP Location: Left Arm, Patient Position: Sitting, Cuff Size: Normal)   Pulse 62   Temp 98 F (36.7 C) (Temporal)   Ht 5' 2.21 (1.58 m)   Wt 192 lb 8 oz (87.3 kg)   SpO2 96%   BMI 34.98 kg/m  Body mass index is 34.98 kg/m. Physical Exam: GEN: alert, well developed HEENT: clear conjunctiva, nose with mild inferior turbinate hypertrophy, pink nasal mucosa, no rhinorrhea, + cobblestoning HEART: regular rate and rhythm, no murmur LUNGS: clear to auscultation bilaterally, no  coughing, unlabored respiration SKIN: no rashes or lesions  Spirometry:  Tracings reviewed. Her effort: Good reproducible efforts. FVC: 2.04L, 86% predicted  FEV1: 1.62L, 86% predicted FEV1/FVC ratio: 79% Interpretation: Spirometry consistent with normal pattern.  Please see scanned spirometry results for details.   Assessment:   1. Seasonal and perennial allergic rhinitis   2. Gastroesophageal reflux disease without esophagitis   3. Mild persistent asthma without complication     Plan/Recommendations:    Mild Persistent Asthma  - Well controlled, reduce ICS LABA dose. Use Albuterol  only for rescue with symptoms. Spirometry today was normal. MDI technique discussed.  - Maintenance inhaler: reduce to Wixela 100-50mcg 1 puff twice daily.  - Rescue inhaler: Albuterol  2 puffs via spacer or 1 vial via nebulizer every 4-6 hours as needed for respiratory symptoms of cough, shortness of breath, or wheezing Asthma control goals:  Full participation in all desired activities (may need albuterol  before activity) Albuterol  use two times or less a week on average (not counting use with activity) Cough interfering with sleep two times or less a month Oral steroids no more than once a year No hospitalizations   Allergic Rhinitis: - Controlled.  - Positive skin test 08/2022: grasses, molds, dust mite, horses  - Use nasal saline rinses before nose sprays such as with Neilmed Sinus Rinse.  Use distilled water.   - Use Flonase  1-2 sprays  each nostril daily. Aim upward and outward. - Use Zyrtec  (Cetirizine ) 10 mg daily as needed for runny nose, sneezing, itchy watery eyes.  - Consider allergy  shots as long term control of your symptoms by teaching your immune system to be more tolerant of your allergy  triggers.   GERD - Controlled. Reduce to Protonix  20mg  daily. Take on empty stomach.  -Avoid lying down for at least two hours after a meal or after drinking acidic beverages, like soda, or  other caffeinated beverages. This can help to prevent stomach contents from flowing back into the esophagus. -Keep your head elevated while you sleep. Using an extra pillow or two can also help to prevent reflux. -Eat smaller and more frequent meals each day instead of a few large meals. This promotes digestion and can aid in preventing heartburn. -Wear loose-fitting clothes to ease pressure on the stomach, which can worsen heartburn and reflux. -Reduce excess weight around the midsection. This can ease pressure on the stomach. Such pressure can force some stomach contents back up the esophagus.     Return in about 3 months (around 02/06/2024).  Arleta Blanch, MD [facility] 

## 2023-11-06 NOTE — Addendum Note (Signed)
 Addended by: AZALEA, Quantrell Splitt on: 11/06/2023 04:54 PM   Modules accepted: Orders

## 2023-11-06 NOTE — Addendum Note (Signed)
 Addended by: AZALEA, Panayiotis Rain[place] on: 11/06/2023 03:33 PM   Modules accepted: Orders

## 2023-11-08 ENCOUNTER — Other Ambulatory Visit: Payer: Self-pay | Admitting: Family

## 2023-11-22 ENCOUNTER — Emergency Department (HOSPITAL_COMMUNITY)

## 2023-11-22 ENCOUNTER — Emergency Department (HOSPITAL_COMMUNITY)
Admission: EM | Admit: 2023-11-22 | Discharge: 2023-11-22 | Disposition: A | Discharge status: Home / Self Care | Attending: Emergency Medicine | Admitting: Emergency Medicine

## 2023-11-22 ENCOUNTER — Other Ambulatory Visit: Payer: Self-pay

## 2023-11-22 ENCOUNTER — Encounter (HOSPITAL_COMMUNITY): Payer: Self-pay

## 2023-11-22 DIAGNOSIS — Y92410 Unspecified street and highway as the place of occurrence of the external cause: Secondary | ICD-10-CM | POA: Insufficient documentation

## 2023-11-22 DIAGNOSIS — S7002XA Contusion of left hip, initial encounter: Secondary | ICD-10-CM | POA: Diagnosis not present

## 2023-11-22 DIAGNOSIS — Z7951 Long term (current) use of inhaled steroids: Secondary | ICD-10-CM | POA: Insufficient documentation

## 2023-11-22 DIAGNOSIS — J45909 Unspecified asthma, uncomplicated: Secondary | ICD-10-CM | POA: Insufficient documentation

## 2023-11-22 DIAGNOSIS — S3991XA Unspecified injury of abdomen, initial encounter: Secondary | ICD-10-CM | POA: Diagnosis not present

## 2023-11-22 DIAGNOSIS — S7011XA Contusion of right thigh, initial encounter: Secondary | ICD-10-CM | POA: Diagnosis not present

## 2023-11-22 DIAGNOSIS — M25551 Pain in right hip: Secondary | ICD-10-CM | POA: Diagnosis not present

## 2023-11-22 DIAGNOSIS — S7001XA Contusion of right hip, initial encounter: Secondary | ICD-10-CM | POA: Diagnosis not present

## 2023-11-22 DIAGNOSIS — R079 Chest pain, unspecified: Secondary | ICD-10-CM | POA: Diagnosis not present

## 2023-11-22 DIAGNOSIS — M25552 Pain in left hip: Secondary | ICD-10-CM | POA: Diagnosis not present

## 2023-11-22 LAB — CBC WITH DIFFERENTIAL/PLATELET
Abs Immature Granulocytes: 0.13 K/uL — ABNORMAL HIGH (ref 0.00–0.07)
Basophils Absolute: 0 K/uL (ref 0.0–0.1)
Basophils Relative: 0 %
Eosinophils Absolute: 0.2 K/uL (ref 0.0–0.5)
Eosinophils Relative: 2 %
HCT: 37.4 % (ref 36.0–46.0)
Hemoglobin: 11.7 g/dL — ABNORMAL LOW (ref 12.0–15.0)
Immature Granulocytes: 1 %
Lymphocytes Relative: 21 %
Lymphs Abs: 2.1 K/uL (ref 0.7–4.0)
MCH: 27.5 pg (ref 26.0–34.0)
MCHC: 31.3 g/dL (ref 30.0–36.0)
MCV: 88 fL (ref 80.0–100.0)
Monocytes Absolute: 0.6 K/uL (ref 0.1–1.0)
Monocytes Relative: 6 %
Neutro Abs: 6.9 K/uL (ref 1.7–7.7)
Neutrophils Relative %: 70 %
Platelets: 339 K/uL (ref 150–400)
RBC: 4.25 MIL/uL (ref 3.87–5.11)
RDW: 15.1 % (ref 11.5–15.5)
WBC: 10.1 K/uL (ref 4.0–10.5)
nRBC: 0 % (ref 0.0–0.2)

## 2023-11-22 LAB — I-STAT CHEM 8, ED
BUN: 19 mg/dL (ref 8–23)
Calcium, Ion: 1.04 mmol/L — ABNORMAL LOW (ref 1.15–1.40)
Chloride: 106 mmol/L (ref 98–111)
Creatinine, Ser: 1.1 mg/dL — ABNORMAL HIGH (ref 0.44–1.00)
Glucose, Bld: 98 mg/dL (ref 70–99)
HCT: 31 % — ABNORMAL LOW (ref 36.0–46.0)
Hemoglobin: 10.5 g/dL — ABNORMAL LOW (ref 12.0–15.0)
Potassium: 4.8 mmol/L (ref 3.5–5.1)
Sodium: 139 mmol/L (ref 135–145)
TCO2: 27 mmol/L (ref 22–32)

## 2023-11-22 MED ORDER — ONDANSETRON 4 MG PO TBDP: 4.0000 mg | ORAL_TABLET | Freq: Three times a day (TID) | ORAL | 0 refills | Status: DC | PRN

## 2023-11-22 MED ORDER — MORPHINE SULFATE (PF) 4 MG/ML IV SOLN
4.0000 mg | Freq: Once | INTRAVENOUS | Status: AC
  Administered 2023-11-22: 4 mg via INTRAVENOUS
  Filled 2023-11-22: qty 1

## 2023-11-22 MED ORDER — IOHEXOL 350 MG/ML SOLN
75.0000 mL | Freq: Once | INTRAVENOUS | Status: AC | PRN
  Administered 2023-11-22: 75 mL via INTRAVENOUS

## 2023-11-22 MED ORDER — ONDANSETRON HCL 4 MG/2ML IJ SOLN
4.0000 mg | Freq: Once | INTRAMUSCULAR | Status: AC
  Administered 2023-11-22: 4 mg via INTRAVENOUS
  Filled 2023-11-22: qty 2

## 2023-11-22 MED ORDER — ONDANSETRON 4 MG PO TBDP
4.0000 mg | ORAL_TABLET | Freq: Once | ORAL | Status: AC
  Administered 2023-11-22: 4 mg via ORAL
  Filled 2023-11-22: qty 1

## 2023-11-22 MED ORDER — OXYCODONE-ACETAMINOPHEN 5-325 MG PO TABS: 1.0000 | ORAL_TABLET | Freq: Four times a day (QID) | ORAL | 0 refills | Status: DC | PRN

## 2023-11-22 NOTE — ED Notes (Signed)
 Patient transported to CT

## 2023-11-22 NOTE — ED Notes (Signed)
 Patient did not want the ace wrap put on leg
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
## 2023-11-27 ENCOUNTER — Other Ambulatory Visit: Payer: Self-pay | Admitting: Family

## 2023-11-27 ENCOUNTER — Ambulatory Visit (INDEPENDENT_AMBULATORY_CARE_PROVIDER_SITE_OTHER): Admitting: Family

## 2023-11-27 ENCOUNTER — Encounter: Payer: Self-pay | Admitting: Family

## 2023-11-27 VITALS — BP 126/74 | HR 64 | Ht 62.0 in | Wt 192.4 lb

## 2023-11-27 DIAGNOSIS — M25551 Pain in right hip: Secondary | ICD-10-CM | POA: Diagnosis not present

## 2023-11-27 DIAGNOSIS — M25552 Pain in left hip: Secondary | ICD-10-CM

## 2023-11-27 MED ORDER — OXYCODONE-ACETAMINOPHEN 5-325 MG PO TABS: 1.0000 | ORAL_TABLET | Freq: Four times a day (QID) | ORAL | 0 refills | Status: DC | PRN
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
## 2023-12-01 ENCOUNTER — Other Ambulatory Visit: Payer: Self-pay | Admitting: Nurse Practitioner

## 2023-12-01 DIAGNOSIS — M25552 Pain in left hip: Secondary | ICD-10-CM | POA: Diagnosis not present

## 2023-12-01 DIAGNOSIS — Z1231 Encounter for screening mammogram for malignant neoplasm of breast: Secondary | ICD-10-CM

## 2023-12-01 DIAGNOSIS — M25551 Pain in right hip: Secondary | ICD-10-CM | POA: Diagnosis not present

## 2023-12-02 ENCOUNTER — Ambulatory Visit
Admission: RE | Admit: 2023-12-02 | Discharge: 2023-12-02 | Disposition: A | Discharge status: Home / Self Care | Source: Ambulatory Visit | Attending: Nurse Practitioner | Admitting: Nurse Practitioner

## 2023-12-02 DIAGNOSIS — Z1231 Encounter for screening mammogram for malignant neoplasm of breast: Secondary | ICD-10-CM

## 2023-12-11 ENCOUNTER — Ambulatory Visit (INDEPENDENT_AMBULATORY_CARE_PROVIDER_SITE_OTHER): Admitting: Nurse Practitioner

## 2023-12-11 VITALS — BP 120/82 | HR 60 | Temp 98.2°F | Ht 62.0 in | Wt 189.0 lb

## 2023-12-11 DIAGNOSIS — R7303 Prediabetes: Secondary | ICD-10-CM

## 2023-12-11 DIAGNOSIS — M545 Low back pain, unspecified: Secondary | ICD-10-CM | POA: Diagnosis not present

## 2023-12-11 NOTE — Assessment & Plan Note (Signed)
 Prediabetes Prediabetes with improved A1c from 6.4 to 5.8, never reaching 6.5. - Monitor blood sugar levels and A1c over time.
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
## 2023-12-20 ENCOUNTER — Other Ambulatory Visit: Payer: Self-pay | Admitting: Internal Medicine

## 2023-12-24 ENCOUNTER — Other Ambulatory Visit: Payer: Medicare Other

## 2024-02-09 ENCOUNTER — Ambulatory Visit: Admitting: Internal Medicine
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
## 2024-02-14 NOTE — Patient Instructions (Signed)
 Asthma Continue Wixela 100-1 puff twice a day to prevent cough and wheeze Continue albuterol  2 puffs once every 4 hours if needed for cough or wheeze You may use albuterol  2 puffs 5-15 minutes before activity to decrease cough or wheeze   Allergic rhinitis Continue allergen avoidance measures directed toward grass pollen, mold, dust mite and horse as listed below Continue cetirizine  10 mg once a day if needed for a runny nose or itch. You may take an additional dose of cetirizine  10 mg once a day if needed for breakthrough symptoms Continue Flonase  2 sprays in each nostril once a day if needed for a stuffy nose Consider saline nasal rinses as needed for nasal symptoms. Use this before any medicated nasal sprays for best result Consider allergen immunotherapy if your symptoms are not well controlled with the treatment plan as listed below.  Reflux Continue dietary and lifestyle modifications as listed below Continue pantoprazole  20 mg once a day for reflux control  Call the clinic if this treatment plan is not working well for you  Follow up in 6 months or sooner if needed.  Reducing Pollen Exposure The American [employer] of All[employer] , Asthma and Immun[employer] suggests the following steps to reduce your exposure to pollen during allergy  seasons. Do not hang sheets or clothing out to dry; pollen may collect on these items. Do not mow lawns or spend time around freshly cut grass; mowing stirs up pollen. Keep windows closed at night.  Keep car windows closed while driving. Minimize morning activities outdoors, a time when pollen counts are usually at their highest. Stay indoors as much as possible when pollen counts or humidity is high and on windy days when pollen tends to remain in the air longer. Use air conditioning when possible.  Many air conditioners have filters that trap the pollen spores. Use a HEPA room air filter to remove pollen form the indoor air you breathe.  Control of Mold  Allergen Mold and fungi can grow on a variety of surfaces provided certain temperature and moisture conditions exist.  Outdoor molds grow on plants, decaying vegetation and soil.  The major outdoor mold, Alternaria and Cladosporium, are found in very high numbers during hot and dry conditions.  Generally, a late Summer - Fall peak is seen for common outdoor fungal spores.  Rain will temporarily lower outdoor mold spore count, but counts rise rapidly when the rainy period ends.  The most important indoor molds are Aspergillus and Penicillium.  Dark, humid and poorly ventilated basements are ideal sites for mold growth.  The next most common sites of mold growth are the bathroom and the kitchen.  Outdoor [employer] Use air conditioning and keep windows closed Avoid exposure to decaying vegetation. Avoid leaf raking. Avoid grain handling. Consider wearing a face mask if working in moldy areas.  Indoor Mold Control Maintain humidity below 50%. Clean washable surfaces with 5% bleach solution. Remove sources e.g. Contaminated carpets.   Control of Dust Mite Allergen Dust mites play a major role in allergic asthma and rhinitis. They occur in environments with high humidity wherever human skin is found. Dust mites absorb humidity from the atmosphere (ie, they do not drink) and feed on organic matter (including shed human and animal skin). Dust mites are a microscopic type of insect that you cannot see with the naked eye. High levels of dust mites have been detected from mattresses, pillows, carpets, upholstered furniture, bed covers, clothes, soft toys and any woven material. The principal allergen of  the dust mite is found in its feces. A gram of dust may contain 1,000 mites and 250,000 fecal particles. Mite antigen is easily measured in the air during house cleaning activities. Dust mites do not bite and do not cause harm to humans, other than by triggering allergies/asthma.  Ways to decrease your  exposure to dust mites in your home:  1. Encase mattresses, box springs and pillows with a mite-impermeable barrier or cover  2. Wash sheets, blankets and drapes weekly in hot water (130 F) with detergent and dry them in a dryer on the hot setting.  3. Have the room cleaned frequently with a vacuum cleaner and a damp dust-mop. For carpeting or rugs, vacuuming with a vacuum cleaner equipped with a high-efficiency particulate air (HEPA) filter. The dust mite allergic individual should not be in a room which is being cleaned and should wait 1 hour after cleaning before going into the room.  4. Do not sleep on upholstered furniture (eg, couches).  5. If possible removing carpeting, upholstered furniture and drapery from the home is ideal. Horizontal blinds should be eliminated in the rooms where the person spends the most time (bedroom, study, television room). Washable vinyl, roller-type shades are optimal.  6. Remove all non-washable stuffed toys from the bedroom. Wash stuffed toys weekly like sheets and blankets above.  7. Reduce indoor humidity to less than 50%. Inexpensive humidity monitors can be purchased at most hardware stores. Do not use a humidifier as can make the problem worse and are not recommended.

## 2024-02-15 ENCOUNTER — Other Ambulatory Visit: Payer: Self-pay

## 2024-02-15 ENCOUNTER — Ambulatory Visit: Admitting: Family Medicine

## 2024-02-15 ENCOUNTER — Encounter: Payer: Self-pay | Admitting: Family Medicine

## 2024-02-15 VITALS — BP 130/66 | HR 67 | Temp 98.2°F | Resp 19

## 2024-02-15 DIAGNOSIS — J302 Other seasonal allergic rhinitis: Secondary | ICD-10-CM

## 2024-02-15 DIAGNOSIS — K219 Gastro-esophageal reflux disease without esophagitis: Secondary | ICD-10-CM | POA: Diagnosis not present

## 2024-02-15 DIAGNOSIS — J4530 Mild persistent asthma, uncomplicated: Secondary | ICD-10-CM | POA: Diagnosis not present

## 2024-02-15 DIAGNOSIS — J3089 Other allergic rhinitis: Secondary | ICD-10-CM | POA: Insufficient documentation

## 2024-02-15 MED ORDER — FLUTICASONE-SALMETEROL 100-50 MCG/ACT IN AEPB: 1.0000 | INHALATION_SPRAY | Freq: Two times a day (BID) | RESPIRATORY_TRACT | 5 refills | Status: AC

## 2024-02-15 MED ORDER — FLUTICASONE PROPIONATE 50 MCG/ACT NA SUSP: 2.0000 | Freq: Every day | NASAL | 1 refills | Status: AC

## 2024-02-15 MED ORDER — PANTOPRAZOLE SODIUM 20 MG PO TBEC: 20.0000 mg | DELAYED_RELEASE_TABLET | Freq: Every day | ORAL | 5 refills | Status: AC

## 2024-02-19 ENCOUNTER — Other Ambulatory Visit: Payer: Self-pay | Admitting: Nurse Practitioner

## 2024-02-19 MED ORDER — MELOXICAM 15 MG PO TABS: 15.0000 mg | ORAL_TABLET | Freq: Every day | ORAL | 0 refills | Status: DC

## 2024-02-19 NOTE — Telephone Encounter (Unsigned)
 Copied from CRM [id number]. Topic: Clinical - Medication Refill >> Feb 19, 2024 11:24 AM Drema MATSU wrote: Medication: meloxicam  (MOBIC ) 15 MG tablet  Has the patient contacted their pharmacy? Yes (Agent: If no, request that the patient contact the pharmacy for the refill. If patient does not wish to contact the pharmacy document the reason why and proceed with request.) stated a request has been sent  (Agent: If yes, when and what did the pharmacy advise?)  This is the patient's preferred pharmacy:  [facility] [phone] [address], [place] - [address] [place] [address] Phone: [phone] Fax: [phone]  Is this the correct pharmacy for this prescription? Yes If no, delete pharmacy and type the correct one.   Has the prescription been filled recently? Yes  Is the patient out of the medication? Yes  Has the patient been seen for an appointment in the last year OR does the patient have an upcoming appointment? Yes  Can we respond through MyChart? Yes  Agent: Please be advised that Rx refills may take up to 3 business days. We ask that you follow-up with your pharmacy.

## 2024-03-16 ENCOUNTER — Other Ambulatory Visit: Payer: Self-pay | Admitting: Family

## 2024-03-21 ENCOUNTER — Other Ambulatory Visit: Payer: Self-pay | Admitting: Nurse Practitioner

## 2024-03-21 NOTE — Telephone Encounter (Signed)
 Copied from CRM #[id number]. Topic: Clinical - Medication Refill >> Mar 21, 2024  3:23 PM Pinkey ORN wrote: Medication: meloxicam  (MOBIC ) 15 MG tablet  Has the patient contacted their pharmacy? Yes (Agent: If no, request that the patient contact the pharmacy for the refill. If patient does not wish to contact the pharmacy document the reason why and proceed with request.) (Agent: If yes, when and what did the pharmacy advise?)  This is the patient's preferred pharmacy:  [facility] [phone] [address], [place] - [address] [place] [address] Phone: [phone] Fax: [phone]   Is this the correct pharmacy for this prescription? Yes If no, delete pharmacy and type the correct one.   Has the prescription been filled recently? Yes  Is the patient out of the medication? Yes  Has the patient been seen for an appointment in the last year OR does the patient have an upcoming appointment? Yes  Can we respond through MyChart? No  Agent: Please be advised that Rx refills may take up to 3 business days. We ask that you follow-up with your pharmacy.

## 2024-03-22 MED ORDER — MELOXICAM 15 MG PO TABS: 15.0000 mg | ORAL_TABLET | Freq: Every day | ORAL | 0 refills | Status: DC

## 2024-04-14 ENCOUNTER — Ambulatory Visit

## 2024-04-15 ENCOUNTER — Ambulatory Visit: Admitting: Nurse Practitioner

## 2024-04-15 VITALS — BP 122/74 | HR 72 | Temp 97.6°F | Ht 62.0 in

## 2024-04-15 DIAGNOSIS — M545 Low back pain, unspecified: Secondary | ICD-10-CM

## 2024-04-15 DIAGNOSIS — R7303 Prediabetes: Secondary | ICD-10-CM

## 2024-04-15 DIAGNOSIS — D649 Anemia, unspecified: Secondary | ICD-10-CM | POA: Diagnosis not present

## 2024-04-15 DIAGNOSIS — E559 Vitamin D deficiency, unspecified: Secondary | ICD-10-CM | POA: Insufficient documentation

## 2024-04-15 LAB — COMPREHENSIVE METABOLIC PANEL WITH GFR
ALT: 9 U/L (ref 3–35)
AST: 14 U/L (ref 5–37)
Albumin: 4.1 g/dL (ref 3.5–5.2)
Alkaline Phosphatase: 77 U/L (ref 39–117)
BUN: 19 mg/dL (ref 6–23)
CO2: 29 meq/L (ref 19–32)
Calcium: 9.5 mg/dL (ref 8.4–10.5)
Chloride: 103 meq/L (ref 96–112)
Creatinine, Ser: 0.86 mg/dL (ref 0.40–1.20)
GFR: 70.4 mL/min
Glucose, Bld: 93 mg/dL (ref 70–99)
Potassium: 3.8 meq/L (ref 3.5–5.1)
Sodium: 138 meq/L (ref 135–145)
Total Bilirubin: 0.3 mg/dL (ref 0.2–1.2)
Total Protein: 8.4 g/dL — ABNORMAL HIGH (ref 6.0–8.3)

## 2024-04-15 LAB — CBC WITH DIFFERENTIAL/PLATELET
Basophils Absolute: 0 10*3/uL (ref 0.0–0.1)
Basophils Relative: 0.8 % (ref 0.0–3.0)
Eosinophils Absolute: 0.2 10*3/uL (ref 0.0–0.7)
Eosinophils Relative: 3.7 % (ref 0.0–5.0)
HCT: 37.7 % (ref 36.0–46.0)
Hemoglobin: 12.5 g/dL (ref 12.0–15.0)
Lymphocytes Relative: 30.6 % (ref 12.0–46.0)
Lymphs Abs: 1.9 10*3/uL (ref 0.7–4.0)
MCHC: 33 g/dL (ref 30.0–36.0)
MCV: 81.7 fl (ref 78.0–100.0)
Monocytes Absolute: 0.5 10*3/uL (ref 0.1–1.0)
Monocytes Relative: 8.3 % (ref 3.0–12.0)
Neutro Abs: 3.4 10*3/uL (ref 1.4–7.7)
Neutrophils Relative %: 56.6 % (ref 43.0–77.0)
Platelets: 349 10*3/uL (ref 150.0–400.0)
RBC: 4.62 Mil/uL (ref 3.87–5.11)
RDW: 16.1 % — ABNORMAL HIGH (ref 11.5–15.5)
WBC: 6.1 10*3/uL (ref 4.0–10.5)

## 2024-04-15 LAB — LIPID PANEL
Cholesterol: 155 mg/dL (ref 28–200)
HDL: 51.1 mg/dL
LDL Cholesterol: 92 mg/dL (ref 10–99)
NonHDL: 104.03
Total CHOL/HDL Ratio: 3
Triglycerides: 62 mg/dL (ref 10.0–149.0)
VLDL: 12.4 mg/dL (ref 0.0–40.0)

## 2024-04-15 LAB — HEMOGLOBIN A1C: Hgb A1c MFr Bld: 6.1 % (ref 4.6–6.5)

## 2024-04-15 LAB — VITAMIN D 25 HYDROXY (VIT D DEFICIENCY, FRACTURES): VITD: 38.25 ng/mL (ref 30.00–100.00)

## 2024-04-15 LAB — TSH: TSH: 1.31 u[IU]/mL (ref 0.35–5.50)

## 2024-04-15 NOTE — Assessment & Plan Note (Signed)
 Prediabetes No recent A1c check since initial evaluation. - Ordered A1c test to assess current glycemic control.

## 2024-04-15 NOTE — Assessment & Plan Note (Signed)
" °  Vitamin D  insufficiency Currently taking vitamin D  supplement with no specific deficiency history. - Ordered vitamin D  level to assess current status. "

## 2024-04-15 NOTE — Assessment & Plan Note (Signed)
 Anemia Slight anemia noted on last labs - Ordered repeat blood work to assess further
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
## 2024-04-18 ENCOUNTER — Ambulatory Visit: Payer: Self-pay | Admitting: Nurse Practitioner

## 2024-04-19 ENCOUNTER — Other Ambulatory Visit: Payer: Self-pay | Admitting: Nurse Practitioner

## 2024-08-15 ENCOUNTER — Ambulatory Visit: Admitting: Family Medicine

## 2024-08-22 ENCOUNTER — Ambulatory Visit: Admitting: Family Medicine

## 2024-10-13 ENCOUNTER — Ambulatory Visit: Admitting: Nurse Practitioner

## 2024-10-20 ENCOUNTER — Ambulatory Visit
